# Patient Record
Sex: Male | Born: 1975 | Race: White | Hispanic: No | Smoking: Current every day smoker
Health system: Southern US, Community
[De-identification: ages and names within clinical notes are randomized; demographics above are authoritative.]

## PROBLEM LIST (undated history)

## (undated) DIAGNOSIS — K5781 Diverticulitis of intestine, part unspecified, with perforation and abscess with bleeding: Secondary | ICD-10-CM

## (undated) DIAGNOSIS — F4310 Post-traumatic stress disorder, unspecified: Secondary | ICD-10-CM

## (undated) DIAGNOSIS — G629 Polyneuropathy, unspecified: Secondary | ICD-10-CM

## (undated) DIAGNOSIS — Z87442 Personal history of urinary calculi: Secondary | ICD-10-CM

## (undated) HISTORY — PX: FOOT SURGERY: SHX648

---

## 2003-09-13 HISTORY — PX: FOOT SURGERY: SHX648

## 2006-02-22 ENCOUNTER — Ambulatory Visit: Payer: Self-pay | Admitting: Family Medicine

## 2014-07-14 ENCOUNTER — Emergency Department: Payer: Self-pay | Admitting: Emergency Medicine

## 2014-07-14 LAB — CBC
HCT: 40.9 % (ref 40.0–52.0)
HGB: 13.6 g/dL (ref 13.0–18.0)
MCH: 31.5 pg (ref 26.0–34.0)
MCHC: 33.3 g/dL (ref 32.0–36.0)
MCV: 95 fL (ref 80–100)
Platelet: 265 10*3/uL (ref 150–440)
RBC: 4.33 10*6/uL — AB (ref 4.40–5.90)
RDW: 12.7 % (ref 11.5–14.5)
WBC: 13.5 10*3/uL — ABNORMAL HIGH (ref 3.8–10.6)

## 2014-07-15 LAB — BASIC METABOLIC PANEL
Anion Gap: 8 (ref 7–16)
BUN: 27 mg/dL — ABNORMAL HIGH (ref 7–18)
CHLORIDE: 104 mmol/L (ref 98–107)
CREATININE: 2.74 mg/dL — AB (ref 0.60–1.30)
Calcium, Total: 8.7 mg/dL (ref 8.5–10.1)
Co2: 27 mmol/L (ref 21–32)
EGFR (African American): 34 — ABNORMAL LOW
GFR CALC NON AF AMER: 28 — AB
Glucose: 96 mg/dL (ref 65–99)
Osmolality: 283 (ref 275–301)
POTASSIUM: 4.3 mmol/L (ref 3.5–5.1)
Sodium: 139 mmol/L (ref 136–145)

## 2014-07-15 LAB — URINALYSIS, COMPLETE
BILIRUBIN, UR: NEGATIVE
Bacteria: NONE SEEN
Glucose,UR: NEGATIVE mg/dL (ref 0–75)
Ketone: NEGATIVE
Leukocyte Esterase: NEGATIVE
NITRITE: NEGATIVE
PH: 6 (ref 4.5–8.0)
Protein: 30
Specific Gravity: 1.005 (ref 1.003–1.030)
Squamous Epithelial: NONE SEEN

## 2015-05-26 ENCOUNTER — Other Ambulatory Visit: Payer: Self-pay | Admitting: Family Medicine

## 2015-05-26 ENCOUNTER — Ambulatory Visit
Admission: RE | Admit: 2015-05-26 | Discharge: 2015-05-26 | Disposition: A | Discharge status: Home / Self Care | Payer: Disability Insurance | Source: Ambulatory Visit | Attending: Family Medicine | Admitting: Family Medicine

## 2015-05-26 DIAGNOSIS — M25562 Pain in left knee: Secondary | ICD-10-CM

## 2015-05-26 DIAGNOSIS — M25552 Pain in left hip: Secondary | ICD-10-CM

## 2015-09-21 ENCOUNTER — Emergency Department: Payer: Disability Insurance

## 2015-09-21 ENCOUNTER — Encounter: Payer: Self-pay | Admitting: *Deleted

## 2015-09-21 ENCOUNTER — Emergency Department
Admission: EM | Admit: 2015-09-21 | Discharge: 2015-09-21 | Disposition: A | Discharge status: Home / Self Care | Payer: Disability Insurance | Attending: Emergency Medicine | Admitting: Emergency Medicine

## 2015-09-21 DIAGNOSIS — F17200 Nicotine dependence, unspecified, uncomplicated: Secondary | ICD-10-CM | POA: Insufficient documentation

## 2015-09-21 DIAGNOSIS — R079 Chest pain, unspecified: Secondary | ICD-10-CM | POA: Insufficient documentation

## 2015-09-21 HISTORY — DX: Polyneuropathy, unspecified: G62.9

## 2015-09-21 LAB — CBC
HEMATOCRIT: 47.3 % (ref 40.0–52.0)
Hemoglobin: 15.9 g/dL (ref 13.0–18.0)
MCH: 30.9 pg (ref 26.0–34.0)
MCHC: 33.6 g/dL (ref 32.0–36.0)
MCV: 92 fL (ref 80.0–100.0)
Platelets: 330 10*3/uL (ref 150–440)
RBC: 5.14 MIL/uL (ref 4.40–5.90)
RDW: 13.2 % (ref 11.5–14.5)
WBC: 15.8 10*3/uL — ABNORMAL HIGH (ref 3.8–10.6)

## 2015-09-21 LAB — BASIC METABOLIC PANEL
Anion gap: 9 (ref 5–15)
BUN: 12 mg/dL (ref 6–20)
CHLORIDE: 105 mmol/L (ref 101–111)
CO2: 24 mmol/L (ref 22–32)
Calcium: 9.6 mg/dL (ref 8.9–10.3)
Creatinine, Ser: 1.15 mg/dL (ref 0.61–1.24)
GFR calc Af Amer: >60 mL/min (ref >60)
GFR calc non Af Amer: >60 mL/min (ref >60)
GLUCOSE: 97 mg/dL (ref 65–99)
POTASSIUM: 4 mmol/L (ref 3.5–5.1)
Sodium: 138 mmol/L (ref 135–145)

## 2015-09-21 LAB — TROPONIN I: Troponin I: <0.03 ng/mL (ref <0.031)

## 2015-09-21 MED ORDER — IBUPROFEN 800 MG PO TABS
800.0000 mg | ORAL_TABLET | Freq: Once | ORAL | Status: AC
  Administered 2015-09-21: 800 mg via ORAL

## 2015-09-21 MED ORDER — IBUPROFEN 800 MG PO TABS
ORAL_TABLET | ORAL | Status: AC
Start: 2015-09-21 — End: 2015-09-21
  Administered 2015-09-21: 800 mg via ORAL
  Filled 2015-09-21: qty 1

## 2015-09-21 MED ORDER — CYCLOBENZAPRINE HCL 10 MG PO TABS: 10.0000 mg | ORAL_TABLET | Freq: Three times a day (TID) | ORAL | Status: DC | PRN

## 2015-09-21 MED ORDER — IBUPROFEN 800 MG PO TABS: 800.0000 mg | ORAL_TABLET | Freq: Three times a day (TID) | ORAL | Status: DC | PRN

## 2015-09-21 MED ORDER — HYDROCODONE-ACETAMINOPHEN 5-325 MG PO TABS: 1.0000 | ORAL_TABLET | Freq: Four times a day (QID) | ORAL | Status: DC | PRN

## 2015-09-21 NOTE — Discharge Instructions (Signed)
Although no certain cause was found for your left sided chest discomfort, your exam and evaluation are reassuring in the ED today.  You do need to follow-up with a cardiologist for elevation and stress test.  I spoke with Dr. Kirke CorinArida, cardiology, and his office will call tomorrow to set up the appointment and stress test this week.   I am most suspicious that your symptoms are due to inflammation. Based on this, I suggest treating with ibuprofen 800 mg every 8 hours as needed for pain and inflammation for one week.  You are also being discharged with muscle relaxer flexeril and few tablets of Norco.  Return to the emergency department for any worsening condition including any worsening chest pain, weakness or numbness, vomiting, black or bloody stools, dizziness or passing out, trouble breathing or pain with breathing, shortness of breath, or any other symptoms concerning to you.   Nonspecific Chest Pain It is often hard to find the cause of chest pain. There is always a chance that your pain could be related to something serious, such as a heart attack or a blood clot in your lungs. Chest pain can also be caused by conditions that are not life-threatening. If you have chest pain, it is very important to follow up with your doctor.  HOME CARE  If you were prescribed an antibiotic medicine, finish it all even if you start to feel better.  Avoid any activities that cause chest pain.  Do not use any tobacco products, including cigarettes, chewing tobacco, or electronic cigarettes. If you need help quitting, ask your doctor.  Do not drink alcohol.  Take medicines only as told by your doctor.  Keep all follow-up visits as told by your doctor. This is important. This includes any further testing if your chest pain does not go away.  Your doctor may tell you to keep your head raised (elevated) while you sleep.  Make lifestyle changes as told by your doctor. These may include:  Getting regular  exercise. Ask your doctor to suggest some activities that are safe for you.  Eating a heart-healthy diet. Your doctor or a diet specialist (dietitian) can help you to learn healthy eating options.  Maintaining a healthy weight.  Managing diabetes, if necessary.  Reducing stress. GET HELP IF:  Your chest pain does not go away, even after treatment.  You have a rash with blisters on your chest.  You have a fever. GET HELP RIGHT AWAY IF:  Your chest pain is worse.  You have an increasing cough, or you cough up blood.  You have severe belly (abdominal) pain.  You feel extremely weak.  You pass out (faint).  You have chills.  You have sudden, unexplained chest discomfort.  You have sudden, unexplained discomfort in your arms, back, neck, or jaw.  You have shortness of breath at any time.  You suddenly start to sweat, or your skin gets clammy.  You feel nauseous.  You vomit.  You suddenly feel light-headed or dizzy.  Your heart begins to beat quickly, or it feels like it is skipping beats. These symptoms may be an emergency. Do not wait to see if the symptoms will go away. Get medical help right away. Call your local emergency services (911 in the [place] Do not drive yourself to the hospital.   This information is not intended to replace advice given to you by your health care provider. Make sure you discuss any questions you have with your health care provider.  Document Released: 02/15/2008 Document Revised: 09/19/2014 Document Reviewed: 04/04/2014 Elsevier Interactive Patient Education [employer]

## 2015-09-21 NOTE — ED Notes (Signed)
AAOx3.  Skin warm and dry.  NAD 

## 2015-09-21 NOTE — ED Notes (Signed)
Patient c/o left side chest pain that is intermittent for 3 days. Patient states it radiates to back and down left arm.

## 2015-09-21 NOTE — ED Provider Notes (Signed)
[facility] Emergency Department Provider Note   ____________________________________________  Time seen: Approximately 4 PM I have reviewed the triage vital signs and the triage nursing note.  HISTORY  Chief Complaint Chest Pain   Historian Patient  HPI Christopher Espinoza is a 40 y.o. male with a smoking history, and possible family history for coronary artery disease, who is here for evaluation of 4 days of waxing and waning left anterior chest wall pain that feels like a spasm that at times has gone into his left shoulder and into his upper left back. No weakness or numbness, but does report some possible tingling at one point time over the left anterior shoulder, and that is gone now.  Symptoms started about 4 days ago at rest, and have been there the entire time but at times worse than others. No exacerbating or alleviating factors. He does not feel this is abdomen/GERD related. He does report being under a lot of stress. He has not had a stress test or cardiology evaluation in the past.  Symptoms at worst were severe, and presently are mild to moderate.    Past Medical History  Diagnosis Date  . 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   Past Surgical History  Procedure Laterality Date  . Foot surgery Right     Current Outpatient Rx  Name  Route  Sig  Dispense  Refill  . cyclobenzaprine (FLEXERIL) 10 MG tablet   Oral   Take 1 tablet (10 mg total) by mouth every 8 (eight) hours as needed for muscle spasms.   20 tablet   0   . HYDROcodone-acetaminophen (NORCO/VICODIN) 5-325 MG tablet   Oral   Take 1 tablet by mouth every 6 (six) hours as needed for moderate pain.   5 tablet   0   . ibuprofen (ADVIL,MOTRIN) 800 MG tablet   Oral   Take 1 tablet (800 mg total) by mouth every 8 (eight) hours as needed.   30 tablet   0     Allergies Gabapentin  No family history on  file.  Social History Social History  Substance Use Topics  . Smoking status: Current Every Day Smoker  . Smokeless tobacco: None  . Alcohol Use: No    Review of Systems  Constitutional: Negative for fever. Eyes: Negative for visual changes. ENT: Negative for sore throat. Cardiovascular: Positive for chest pain. Negative for palpitations Respiratory: Negative for shortness of breath. Gastrointestinal: Negative for abdominal pain, vomiting and diarrhea. Genitourinary: Negative for dysuria. Musculoskeletal: Negative for back pain. Skin: Negative for rash. Neurological: Negative for headache. 10 point Review of Systems otherwise negative ____________________________________________   PHYSICAL EXAM:  VITAL SIGNS: ED Triage Vitals  Enc Vitals Group     BP 09/21/15 1243 144/90 mmHg     Pulse Rate 09/21/15 1243 94     Resp 09/21/15 1243 18     Temp 09/21/15 1243 98.2 F (36.8 C)     Temp Source 09/21/15 1243 Oral     SpO2 09/21/15 1243 97 %     Weight 09/21/15 1243 195 lb (88.451 kg)     Height 09/21/15 1243 5\' 11"  (1.803 m)     Head Cir --      Peak Flow --      Pain Score 09/21/15 1248 7     Pain Loc --      Pain Edu? --  Excl. in [place]? --      Constitutional: Alert and oriented. Well appearing and in no distress. Eyes: Conjunctivae are normal. PERRL. Normal extraocular movements. ENT   Head: Normocephalic and atraumatic.   Nose: No congestion/rhinnorhea.   Mouth/Throat: Mucous membranes are moist.   Neck: No stridor. Cardiovascular/Chest: Normal rate, regular rhythm.  No murmurs, rubs, or gallops. Respiratory: Normal respiratory effort without tachypnea nor retractions. Breath sounds are clear and equal bilaterally. No wheezes/rales/rhonchi. Gastrointestinal: Soft. No distention, no guarding, no rebound. Nontender.    Genitourinary/rectal:Deferred Musculoskeletal: Nontender with normal range of motion in all extremities. No joint effusions.  No  lower extremity tenderness.  No edema. Neurologic:  Normal speech and language. No gross or focal neurologic deficits are appreciated. Skin:  Skin is warm, dry and intact. No rash noted. Psychiatric: Mood and affect are normal. Speech and behavior are normal. Patient exhibits appropriate insight and judgment.  ____________________________________________   EKG I, Governor Rooksebecca Briannia Laba, MD, the attending physician have personally viewed and interpreted all ECGs.  90 beats minute. Normal sinus rhythm with sinus arrhythmia. Narrow QRS. Normal axis. Normal ST and T-wave ____________________________________________  LABS (pertinent positives/negatives)  Basic metabolic panel within normal limits White blood count 15.8, hemoglobin 15.9 platelet count 3:30 Troponin less than 0.03  ____________________________________________  RADIOLOGY All Xrays were viewed by me. Imaging interpreted by Radiologist.  Chest 2 view: No active cardiopulmonary disease __________________________________________  PROCEDURES  Procedure(s) performed: None  Critical Care performed: None  ____________________________________________   ED COURSE / ASSESSMENT AND PLAN  CONSULTATIONS: Dr. Kirke CorinArida, cardiology, patient will be called tomorrow by the office to schedule appointment and stress test  Pertinent labs & imaging results that were available during my care of the patient were reviewed by me and considered in my medical decision making (see chart for details).  The patient has left-sided chest pain to the left shoulder and back at times, without any other associated symptoms. His exam and evaluation are reassuring with normal EKG and laboratory evaluation. His symptoms have been ongoing now for 4 days, and his troponin is negative. He does not have pleuritic chest pain, shortness of breath, fever or hypoxia, and I'm not suspicious for pulmonary embolism. Symptoms and physical exam aren't not raising my clinical  suspicion of vascular/aortic emergency. I discussed with the patient that I don't recommend further imaging at this point in time based on his reassuring exam and evaluation.  His symptoms do seem musculoskeletal, he reports pain when he moves side to side and when he palpates his left pectoral and left deltoid muscle. I question whether this could be costochondritis even with some medial tenderness over the sternal wall.  I'm going to treat him symptomatically with ibuprofen, Flexeril and a couple tablets of hydrocodone.  I discussed this case with the on-call cardiologist, who will see the patient this week.   Patient / Family / Caregiver informed of clinical course, medical decision-making process, and agree with plan.   I discussed return precautions, follow-up instructions, and discharged instructions with patient and/or family.  ___________________________________________   FINAL CLINICAL IMPRESSION(S) / ED DIAGNOSES   Final diagnoses:  Nonspecific chest pain              Note: This dictation was prepared with Dragon dictation. Any transcriptional errors that result from this process are unintentional   Governor Rooksebecca Kialee Kham, MD 09/21/15 1719

## 2015-09-22 ENCOUNTER — Other Ambulatory Visit: Payer: Self-pay

## 2015-09-22 ENCOUNTER — Telehealth: Payer: Self-pay

## 2015-09-22 DIAGNOSIS — R079 Chest pain, unspecified: Secondary | ICD-10-CM

## 2015-09-22 NOTE — Telephone Encounter (Signed)
Per Dr. Kirke CorinArida: "This patient was referred from the emergency room for chest pain. Schedul him for a treadmill stress test and follow-up after that." S/w pt who states he will need clearance [facility] before proceeding w/test. He had to call to 'get permission" to go to the ED on 1/9 for chest pain.  He is awaiting a return call from [facility] and will call us back when he has clearance to schedule.  States he can not walk on the treadmill d/t disability. He walks with a cane. Will make Dr. Kirke CorinArida aware

## 2015-10-05 ENCOUNTER — Telehealth: Payer: Self-pay

## 2015-10-05 NOTE — Telephone Encounter (Signed)
S/w pt who states he doesn't have an answer from the [place] regarding pt need for stress test. States he just signed papers today at [facility] to have his records sent there. At that point, if they find the test necessary, it would probably be scheduled at the [place]. Advised pt to notify us when a decision has been made. Pt agreeable w/plan with no further instructions.

## 2021-07-16 ENCOUNTER — Inpatient Hospital Stay: Payer: No Typology Code available for payment source | Admitting: Anesthesiology

## 2021-07-16 ENCOUNTER — Other Ambulatory Visit: Payer: Self-pay

## 2021-07-16 ENCOUNTER — Emergency Department: Payer: No Typology Code available for payment source

## 2021-07-16 ENCOUNTER — Encounter: Payer: Self-pay | Admitting: Emergency Medicine

## 2021-07-16 ENCOUNTER — Encounter
Admission: EM | Disposition: A | Discharge status: Home Health | Payer: No Typology Code available for payment source | Source: Home / Self Care | Attending: General Surgery

## 2021-07-16 ENCOUNTER — Inpatient Hospital Stay
Admission: EM | Admit: 2021-07-16 | Discharge: 2021-07-21 | DRG: 329 | Disposition: A | Discharge status: Home Health | Payer: No Typology Code available for payment source | Attending: General Surgery | Admitting: General Surgery

## 2021-07-16 DIAGNOSIS — R911 Solitary pulmonary nodule: Secondary | ICD-10-CM

## 2021-07-16 DIAGNOSIS — K760 Fatty (change of) liver, not elsewhere classified: Secondary | ICD-10-CM | POA: Diagnosis present

## 2021-07-16 DIAGNOSIS — E872 Acidosis, unspecified: Secondary | ICD-10-CM | POA: Diagnosis present

## 2021-07-16 DIAGNOSIS — F17210 Nicotine dependence, cigarettes, uncomplicated: Secondary | ICD-10-CM | POA: Diagnosis present

## 2021-07-16 DIAGNOSIS — Z91013 Allergy to seafood: Secondary | ICD-10-CM | POA: Diagnosis not present

## 2021-07-16 DIAGNOSIS — U071 COVID-19: Secondary | ICD-10-CM | POA: Diagnosis present

## 2021-07-16 DIAGNOSIS — K650 Generalized (acute) peritonitis: Secondary | ICD-10-CM | POA: Diagnosis present

## 2021-07-16 DIAGNOSIS — K5720 Diverticulitis of large intestine with perforation and abscess without bleeding: Secondary | ICD-10-CM | POA: Diagnosis present

## 2021-07-16 DIAGNOSIS — J9811 Atelectasis: Secondary | ICD-10-CM | POA: Diagnosis present

## 2021-07-16 DIAGNOSIS — K631 Perforation of intestine (nontraumatic): Secondary | ICD-10-CM

## 2021-07-16 DIAGNOSIS — I700 Atherosclerosis of aorta: Secondary | ICD-10-CM | POA: Diagnosis present

## 2021-07-16 HISTORY — PX: COLOSTOMY: SHX63

## 2021-07-16 HISTORY — PX: LAPAROTOMY: SHX154

## 2021-07-16 HISTORY — PX: COLON RESECTION SIGMOID: SHX6737

## 2021-07-16 LAB — RESP PANEL BY RT-PCR (FLU A&B, COVID) ARPGX2
Influenza A by PCR: NEGATIVE
Influenza B by PCR: NEGATIVE
SARS Coronavirus 2 by RT PCR: POSITIVE — AB

## 2021-07-16 LAB — URINALYSIS, ROUTINE W REFLEX MICROSCOPIC
Bilirubin Urine: NEGATIVE
Glucose, UA: NEGATIVE mg/dL
Hgb urine dipstick: NEGATIVE
Ketones, ur: 5 mg/dL — AB
Leukocytes,Ua: NEGATIVE
Nitrite: NEGATIVE
Protein, ur: 100 mg/dL — AB
Specific Gravity, Urine: 1.03 (ref 1.005–1.030)
pH: 5 (ref 5.0–8.0)

## 2021-07-16 LAB — COMPREHENSIVE METABOLIC PANEL
ALT: 49 U/L — ABNORMAL HIGH (ref 0–44)
AST: 34 U/L (ref 15–41)
Albumin: 4.6 g/dL (ref 3.5–5.0)
Alkaline Phosphatase: 55 U/L (ref 38–126)
Anion gap: 10 (ref 5–15)
BUN: 17 mg/dL (ref 6–20)
CO2: 25 mmol/L (ref 22–32)
Calcium: 8.9 mg/dL (ref 8.9–10.3)
Chloride: 98 mmol/L (ref 98–111)
Creatinine, Ser: 1.28 mg/dL — ABNORMAL HIGH (ref 0.61–1.24)
GFR, Estimated: >60 mL/min (ref >60)
Glucose, Bld: 131 mg/dL — ABNORMAL HIGH (ref 70–99)
Potassium: 4 mmol/L (ref 3.5–5.1)
Sodium: 133 mmol/L — ABNORMAL LOW (ref 135–145)
Total Bilirubin: 0.8 mg/dL (ref 0.3–1.2)
Total Protein: 7.5 g/dL (ref 6.5–8.1)

## 2021-07-16 LAB — LACTIC ACID, PLASMA: Lactic Acid, Venous: 2 mmol/L (ref 0.5–1.9)

## 2021-07-16 LAB — TYPE AND SCREEN
ABO/RH(D): B POS
Antibody Screen: NEGATIVE

## 2021-07-16 LAB — CBC
HCT: 47.8 % (ref 39.0–52.0)
Hemoglobin: 16.7 g/dL (ref 13.0–17.0)
MCH: 32.6 pg (ref 26.0–34.0)
MCHC: 34.9 g/dL (ref 30.0–36.0)
MCV: 93.2 fL (ref 80.0–100.0)
Platelets: 198 10*3/uL (ref 150–400)
RBC: 5.13 MIL/uL (ref 4.22–5.81)
RDW: 12.7 % (ref 11.5–15.5)
WBC: 11.4 10*3/uL — ABNORMAL HIGH (ref 4.0–10.5)
nRBC: 0 % (ref 0.0–0.2)

## 2021-07-16 LAB — PROTIME-INR
INR: 1 (ref 0.8–1.2)
Prothrombin Time: 13.4 seconds (ref 11.4–15.2)

## 2021-07-16 LAB — LIPASE, BLOOD: Lipase: 37 U/L (ref 11–51)

## 2021-07-16 LAB — ABO/RH: ABO/RH(D): B POS

## 2021-07-16 SURGERY — LAPAROTOMY, EXPLORATORY
Anesthesia: General

## 2021-07-16 MED ORDER — PROPOFOL 10 MG/ML IV BOLUS
INTRAVENOUS | Status: AC
  Filled 2021-07-16: qty 20

## 2021-07-16 MED ORDER — PIPERACILLIN-TAZOBACTAM 3.375 G IVPB
3.3750 g | Freq: Three times a day (TID) | INTRAVENOUS | Status: DC
  Administered 2021-07-16 – 2021-07-21 (×15): 3.375 g via INTRAVENOUS
  Filled 2021-07-16 (×15): qty 50

## 2021-07-16 MED ORDER — LORAZEPAM 2 MG/ML IJ SOLN
1.0000 mg | INTRAMUSCULAR | Status: DC | PRN
  Administered 2021-07-16 – 2021-07-19 (×10): 1 mg via INTRAVENOUS
  Filled 2021-07-16 (×10): qty 1

## 2021-07-16 MED ORDER — DEXAMETHASONE SODIUM PHOSPHATE 10 MG/ML IJ SOLN
INTRAMUSCULAR | Status: DC | PRN
Start: 2021-07-16 — End: 2021-07-16
  Administered 2021-07-16: 10 mg via INTRAVENOUS

## 2021-07-16 MED ORDER — KETOROLAC TROMETHAMINE 30 MG/ML IJ SOLN
INTRAMUSCULAR | Status: AC
  Filled 2021-07-16: qty 1

## 2021-07-16 MED ORDER — FENTANYL CITRATE (PF) 100 MCG/2ML IJ SOLN
25.0000 ug | INTRAMUSCULAR | Status: DC | PRN
  Administered 2021-07-16 (×2): 25 ug via INTRAVENOUS

## 2021-07-16 MED ORDER — KETAMINE HCL 50 MG/5ML IJ SOSY
PREFILLED_SYRINGE | INTRAMUSCULAR | Status: AC
  Filled 2021-07-16: qty 5

## 2021-07-16 MED ORDER — LIDOCAINE HCL (CARDIAC) PF 100 MG/5ML IV SOSY
PREFILLED_SYRINGE | INTRAVENOUS | Status: DC | PRN
  Administered 2021-07-16: 50 mg via INTRAVENOUS

## 2021-07-16 MED ORDER — KETOROLAC TROMETHAMINE 30 MG/ML IJ SOLN
INTRAMUSCULAR | Status: DC | PRN
  Administered 2021-07-16: 15 mg via INTRAVENOUS

## 2021-07-16 MED ORDER — PHENYLEPHRINE HCL (PRESSORS) 10 MG/ML IV SOLN
INTRAVENOUS | Status: AC
  Filled 2021-07-16: qty 1

## 2021-07-16 MED ORDER — PHENYLEPHRINE HCL-NACL 20-0.9 MG/250ML-% IV SOLN
INTRAVENOUS | Status: DC | PRN
  Administered 2021-07-16: 30 ug/min via INTRAVENOUS

## 2021-07-16 MED ORDER — LIDOCAINE HCL (PF) 2 % IJ SOLN
INTRAMUSCULAR | Status: AC
  Filled 2021-07-16: qty 5

## 2021-07-16 MED ORDER — PIPERACILLIN-TAZOBACTAM 3.375 G IVPB 30 MIN
3.3750 g | Freq: Once | INTRAVENOUS | Status: AC
  Administered 2021-07-16: 3.375 g via INTRAVENOUS
  Filled 2021-07-16: qty 50

## 2021-07-16 MED ORDER — BUPIVACAINE-EPINEPHRINE (PF) 0.5% -1:200000 IJ SOLN
INTRAMUSCULAR | Status: AC
  Filled 2021-07-16: qty 30

## 2021-07-16 MED ORDER — SUGAMMADEX SODIUM 500 MG/5ML IV SOLN
INTRAVENOUS | Status: DC | PRN
  Administered 2021-07-16: 150 mg via INTRAVENOUS

## 2021-07-16 MED ORDER — BUPIVACAINE LIPOSOME 1.3 % IJ SUSP
INTRAMUSCULAR | Status: AC
  Filled 2021-07-16: qty 20

## 2021-07-16 MED ORDER — SODIUM CHLORIDE 0.9 % IV SOLN: INTRAVENOUS | Status: DC | PRN

## 2021-07-16 MED ORDER — FENTANYL CITRATE (PF) 100 MCG/2ML IJ SOLN
INTRAMUSCULAR | Status: AC
  Filled 2021-07-16: qty 2

## 2021-07-16 MED ORDER — LACTATED RINGERS IV SOLN: INTRAVENOUS | Status: DC | PRN

## 2021-07-16 MED ORDER — SODIUM CHLORIDE 0.9 % IV SOLN
INTRAVENOUS | Status: DC | PRN
  Administered 2021-07-16: 70 mL

## 2021-07-16 MED ORDER — DEXMEDETOMIDINE HCL IN NACL 200 MCG/50ML IV SOLN
INTRAVENOUS | Status: DC | PRN
  Administered 2021-07-16 (×2): 4 ug via INTRAVENOUS
  Administered 2021-07-16 (×2): 8 ug via INTRAVENOUS
  Administered 2021-07-16: 10 ug via INTRAVENOUS
  Administered 2021-07-16 (×2): 8 ug via INTRAVENOUS
  Administered 2021-07-16: 10 ug via INTRAVENOUS

## 2021-07-16 MED ORDER — PHENYLEPHRINE HCL-NACL 20-0.9 MG/250ML-% IV SOLN
INTRAVENOUS | Status: AC
  Filled 2021-07-16: qty 250

## 2021-07-16 MED ORDER — SUCCINYLCHOLINE CHLORIDE 200 MG/10ML IV SOSY
PREFILLED_SYRINGE | INTRAVENOUS | Status: DC | PRN
  Administered 2021-07-16: 100 mg via INTRAVENOUS

## 2021-07-16 MED ORDER — BUPIVACAINE-EPINEPHRINE (PF) 0.5% -1:200000 IJ SOLN
INTRAMUSCULAR | Status: DC | PRN
  Administered 2021-07-16: 30 mL

## 2021-07-16 MED ORDER — FENTANYL CITRATE (PF) 100 MCG/2ML IJ SOLN
INTRAMUSCULAR | Status: DC | PRN
  Administered 2021-07-16 (×4): 50 ug via INTRAVENOUS

## 2021-07-16 MED ORDER — 0.9 % SODIUM CHLORIDE (POUR BTL) OPTIME
TOPICAL | Status: DC | PRN
  Administered 2021-07-16: 3000 mL

## 2021-07-16 MED ORDER — ONDANSETRON HCL 4 MG/2ML IJ SOLN
INTRAMUSCULAR | Status: DC | PRN
  Administered 2021-07-16: 4 mg via INTRAVENOUS

## 2021-07-16 MED ORDER — PROPOFOL 10 MG/ML IV BOLUS
INTRAVENOUS | Status: DC | PRN
  Administered 2021-07-16: 170 mg via INTRAVENOUS

## 2021-07-16 MED ORDER — ROCURONIUM BROMIDE 100 MG/10ML IV SOLN
INTRAVENOUS | Status: DC | PRN
Start: 2021-07-16 — End: 2021-07-16
  Administered 2021-07-16: 10 mg via INTRAVENOUS
  Administered 2021-07-16: 20 mg via INTRAVENOUS
  Administered 2021-07-16: 30 mg via INTRAVENOUS
  Administered 2021-07-16: 40 mg via INTRAVENOUS

## 2021-07-16 MED ORDER — ROCURONIUM BROMIDE 10 MG/ML (PF) SYRINGE
PREFILLED_SYRINGE | INTRAVENOUS | Status: AC
  Filled 2021-07-16: qty 10

## 2021-07-16 MED ORDER — KETAMINE HCL 10 MG/ML IJ SOLN
INTRAMUSCULAR | Status: DC | PRN
  Administered 2021-07-16 (×2): 10 mg via INTRAVENOUS
  Administered 2021-07-16: 30 mg via INTRAVENOUS

## 2021-07-16 MED ORDER — ACETAMINOPHEN 10 MG/ML IV SOLN
INTRAVENOUS | Status: DC | PRN
Start: 2021-07-16 — End: 2021-07-16
  Administered 2021-07-16: 1000 mg via INTRAVENOUS

## 2021-07-16 MED ORDER — SODIUM CHLORIDE 0.9 % IV BOLUS (SEPSIS)
1000.0000 mL | Freq: Once | INTRAVENOUS | Status: AC
  Administered 2021-07-16: 1000 mL via INTRAVENOUS

## 2021-07-16 MED ORDER — DEXAMETHASONE SODIUM PHOSPHATE 10 MG/ML IJ SOLN
INTRAMUSCULAR | Status: AC
  Filled 2021-07-16: qty 1

## 2021-07-16 MED ORDER — DEXMEDETOMIDINE HCL IN NACL 200 MCG/50ML IV SOLN
INTRAVENOUS | Status: AC
  Filled 2021-07-16: qty 50

## 2021-07-16 MED ORDER — OXYCODONE HCL 5 MG PO TABS: 5.0000 mg | ORAL_TABLET | Freq: Once | ORAL | Status: DC | PRN

## 2021-07-16 MED ORDER — SODIUM CHLORIDE 0.9 % IV SOLN: INTRAVENOUS | Status: DC

## 2021-07-16 MED ORDER — ENOXAPARIN SODIUM 40 MG/0.4ML IJ SOSY
40.0000 mg | PREFILLED_SYRINGE | INTRAMUSCULAR | Status: DC
  Administered 2021-07-17 – 2021-07-21 (×5): 40 mg via SUBCUTANEOUS
  Filled 2021-07-16 (×5): qty 0.4

## 2021-07-16 MED ORDER — OXYCODONE HCL 5 MG/5ML PO SOLN: 5.0000 mg | Freq: Once | ORAL | Status: DC | PRN

## 2021-07-16 MED ORDER — PHENYLEPHRINE HCL (PRESSORS) 10 MG/ML IV SOLN
INTRAVENOUS | Status: DC | PRN
  Administered 2021-07-16 (×2): 160 ug via INTRAVENOUS

## 2021-07-16 MED ORDER — ONDANSETRON HCL 4 MG/2ML IJ SOLN
INTRAMUSCULAR | Status: AC
  Filled 2021-07-16: qty 2

## 2021-07-16 MED ORDER — PROMETHAZINE HCL 25 MG/ML IJ SOLN: 6.2500 mg | INTRAMUSCULAR | Status: DC | PRN

## 2021-07-16 MED ORDER — PHENOL 1.4 % MT LIQD
1.0000 | OROMUCOSAL | Status: DC | PRN
  Administered 2021-07-16: 1 via OROMUCOSAL
  Filled 2021-07-16 (×2): qty 177

## 2021-07-16 MED ORDER — SODIUM CHLORIDE (PF) 0.9 % IJ SOLN
INTRAMUSCULAR | Status: AC
  Filled 2021-07-16: qty 50

## 2021-07-16 MED ORDER — MEPERIDINE HCL 25 MG/ML IJ SOLN: 6.2500 mg | INTRAMUSCULAR | Status: DC | PRN

## 2021-07-16 MED ORDER — MIDAZOLAM HCL 2 MG/2ML IJ SOLN
INTRAMUSCULAR | Status: DC | PRN
Start: 2021-07-16 — End: 2021-07-16
  Administered 2021-07-16: 2 mg via INTRAVENOUS

## 2021-07-16 MED ORDER — MIDAZOLAM HCL 2 MG/2ML IJ SOLN
INTRAMUSCULAR | Status: AC
  Filled 2021-07-16: qty 2

## 2021-07-16 MED ORDER — ONDANSETRON HCL 4 MG/2ML IJ SOLN
4.0000 mg | Freq: Once | INTRAMUSCULAR | Status: AC
  Administered 2021-07-16: 4 mg via INTRAVENOUS
  Filled 2021-07-16: qty 2

## 2021-07-16 MED ORDER — HYDROMORPHONE HCL 1 MG/ML IJ SOLN
1.0000 mg | Freq: Once | INTRAMUSCULAR | Status: AC
  Administered 2021-07-16: 1 mg via INTRAVENOUS
  Filled 2021-07-16: qty 1

## 2021-07-16 MED ORDER — CHLORHEXIDINE GLUCONATE CLOTH 2 % EX PADS
6.0000 | MEDICATED_PAD | Freq: Every day | CUTANEOUS | Status: DC
  Administered 2021-07-17 – 2021-07-19 (×3): 6 via TOPICAL

## 2021-07-16 MED ORDER — ACETAMINOPHEN 10 MG/ML IV SOLN
INTRAVENOUS | Status: AC
  Filled 2021-07-16: qty 100

## 2021-07-16 MED ORDER — SUGAMMADEX SODIUM 500 MG/5ML IV SOLN
INTRAVENOUS | Status: AC
  Filled 2021-07-16: qty 5

## 2021-07-16 MED ORDER — MORPHINE SULFATE (PF) 4 MG/ML IV SOLN
4.0000 mg | INTRAVENOUS | Status: DC | PRN
  Administered 2021-07-16 – 2021-07-19 (×12): 4 mg via INTRAVENOUS
  Filled 2021-07-16 (×12): qty 1

## 2021-07-16 SURGICAL SUPPLY — 44 items
BLADE CLIPPER SURG (BLADE) ×4 IMPLANT
BULB RESERV EVAC DRAIN JP 100C (MISCELLANEOUS) ×4 IMPLANT
CHLORAPREP W/TINT 26 (MISCELLANEOUS) ×4 IMPLANT
DRAIN CHANNEL JP 19F (MISCELLANEOUS) ×4 IMPLANT
DRAPE LAPAROTOMY 100X77 ABD (DRAPES) ×4 IMPLANT
DRSG OPSITE POSTOP 4X10 (GAUZE/BANDAGES/DRESSINGS) ×4 IMPLANT
DRSG OPSITE POSTOP 4X8 (GAUZE/BANDAGES/DRESSINGS) IMPLANT
DRSG TEGADERM 4X10 (GAUZE/BANDAGES/DRESSINGS) IMPLANT
DRSG TELFA 3X8 NADH (GAUZE/BANDAGES/DRESSINGS) IMPLANT
ELECT BLADE 6.5 EXT (BLADE) ×4 IMPLANT
ELECT REM PT RETURN 9FT ADLT (ELECTROSURGICAL) ×4
ELECTRODE REM PT RTRN 9FT ADLT (ELECTROSURGICAL) ×2 IMPLANT
GAUZE 4X4 16PLY ~~LOC~~+RFID DBL (SPONGE) ×4 IMPLANT
GLOVE SURG ENC MOIS LTX SZ6.5 (GLOVE) ×4 IMPLANT
GLOVE SURG UNDER POLY LF SZ6.5 (GLOVE) ×4 IMPLANT
GOWN STRL REUS W/ TWL LRG LVL3 (GOWN DISPOSABLE) ×4 IMPLANT
GOWN STRL REUS W/TWL LRG LVL3 (GOWN DISPOSABLE) ×4
KIT OSTOMY 2 PC DRNBL 2.25 STR (WOUND CARE) ×2 IMPLANT
KIT OSTOMY DRAINABLE 2.25 STR (WOUND CARE) ×2
KIT TURNOVER KIT A (KITS) ×4 IMPLANT
LABEL OR SOLS (LABEL) ×4 IMPLANT
MANIFOLD NEPTUNE II (INSTRUMENTS) ×4 IMPLANT
NS IRRIG 1000ML POUR BTL (IV SOLUTION) ×4 IMPLANT
PACK BASIN MAJOR ARMC (MISCELLANEOUS) ×4 IMPLANT
PACK COLON CLEAN CLOSURE (MISCELLANEOUS) ×4 IMPLANT
SEALER TISSUE X1 CVD JAW (INSTRUMENTS) ×4 IMPLANT
SPONGE T-LAP 18X18 ~~LOC~~+RFID (SPONGE) ×12 IMPLANT
STAPLER CVD CUT BL 40 RELOAD (ENDOMECHANICALS) ×4 IMPLANT
STAPLER PROXIMATE 75MM BLUE (STAPLE) ×4 IMPLANT
STAPLER SKIN PROX 35W (STAPLE) ×4 IMPLANT
SUT ETHILON 2 0 FS 18 (SUTURE) ×4 IMPLANT
SUT PDS AB 0 CT1 27 (SUTURE) ×8 IMPLANT
SUT PDS AB 1 TP1 54 (SUTURE) IMPLANT
SUT PROLENE 2 0 SH DA (SUTURE) ×4 IMPLANT
SUT SILK 2 0 (SUTURE) ×2
SUT SILK 2-0 18XBRD TIE 12 (SUTURE) ×2 IMPLANT
SUT SILK 3 0 (SUTURE)
SUT SILK 3-0 (SUTURE) ×4 IMPLANT
SUT SILK 3-0 18XBRD TIE 12 (SUTURE) IMPLANT
SUT VIC AB 3-0 SH 27 (SUTURE) ×4
SUT VIC AB 3-0 SH 27X BRD (SUTURE) ×4 IMPLANT
SUT VICRYL 3-0 CR8 SH (SUTURE) ×12 IMPLANT
TRAY FOLEY MTR SLVR 16FR STAT (SET/KITS/TRAYS/PACK) ×4 IMPLANT
WATER STERILE IRR 500ML POUR (IV SOLUTION) ×4 IMPLANT

## 2021-07-16 NOTE — Anesthesia Procedure Notes (Signed)
Procedure Name: Intubation Date/Time: 07/16/2021 8:03 AM Performed by: Reece Agar, CRNA Pre-anesthesia Checklist: Patient identified, Emergency Drugs available, Suction available and Patient being monitored Patient Re-evaluated:Patient Re-evaluated prior to induction Oxygen Delivery Method: Circle system utilized Preoxygenation: Pre-oxygenation with 100% oxygen Induction Type: IV induction and Rapid sequence Laryngoscope Size: McGraph and 4 Grade View: Grade I Tube type: Oral Tube size: 7.0 mm Number of attempts: 1 Airway Equipment and Method: Stylet and Oral airway Placement Confirmation: ETT inserted through vocal cords under direct vision, positive ETCO2 and breath sounds checked- equal and bilateral Secured at: 22 cm Tube secured with: Tape Dental Injury: Teeth and Oropharynx as per pre-operative assessment

## 2021-07-16 NOTE — ED Provider Notes (Signed)
[facility] Emergency Department Provider Note  ____________________________________________   Event Date/Time   First MD Initiated Contact with Patient 07/16/21 [phone]     (approximate)  I have reviewed the triage vital signs and the nursing notes.   HISTORY  Chief Complaint Abdominal Pain    HPI Christopher Espinoza is a 45 y.o. male with history of neuropathy who presents to the emergency department with 2 days of generalized, sharp and severe abdominal pain without radiation.  No aggravating or alleviating factors.  Reports he has had subjective fevers and chills at home as well as severe nausea.  No vomiting or diarrhea.  No dysuria or hematuria.  No previous history of abdominal surgery.  States he has had previous diverticulitis as well as kidney stones.        Past Medical History:  Diagnosis Date   Neuropathy right foot   due to trigger toe    Patient Active Problem List   Diagnosis Date Noted   Diverticulitis of colon with perforation 07/16/2021     Past Surgical History:  Procedure Laterality Date   FOOT SURGERY Right     Prior to Admission medications   Not on File    Allergies Shellfish allergy and Gabapentin  History reviewed. No pertinent family history.  Social History Social History   Tobacco Use   Smoking status: Every Day  Substance Use Topics   Alcohol use: No    Review of Systems Constitutional: No fever. Eyes: No visual changes. ENT: No sore throat. Cardiovascular: Denies chest pain. Respiratory: Denies shortness of breath. Gastrointestinal: No nausea, vomiting, diarrhea. Genitourinary: Negative for dysuria. Musculoskeletal: Negative for back pain. Skin: Negative for rash. Neurological: Negative for focal weakness or numbness.  ____________________________________________   PHYSICAL EXAM:  VITAL SIGNS: ED Triage Vitals [07/16/21 0636]  Enc Vitals Group     BP 111/87     Pulse Rate (!) 106     Resp  20     Temp 99.8 F (37.7 C)     Temp Source Oral     SpO2 93 %     Weight      Height      Head Circumference      Peak Flow      Pain Score      Pain Loc      Pain Edu?      Excl. in [place]?    CONSTITUTIONAL: Alert and oriented and responds appropriately to questions.  Appears pale.  Appears very uncomfortable.  Has a difficult time getting out of the wheelchair and getting into the bed on his own. HEAD: Normocephalic EYES: Conjunctivae clear, pupils appear equal, EOM appear intact ENT: normal nose; moist mucous membranes NECK: Supple, normal ROM CARD: Regular and tachycardic; S1 and S2 appreciated; no murmurs, no clicks, no rubs, no gallops RESP: Normal chest excursion without splinting or tachypnea; breath sounds clear and equal bilaterally; no wheezes, no rhonchi, no rales, no hypoxia or respiratory distress, speaking full sentences ABD/GI: Normal bowel sounds; patient is diffusely tender to palpation with guarding and rebound, nondistended abdomen BACK: The back appears normal EXT: Normal ROM in all joints; no deformity noted, no edema; no cyanosis SKIN: Normal color for age and race; warm; no rash on exposed skin NEURO: Moves all extremities equally PSYCH: The patient's mood and manner are appropriate.  ____________________________________________   LABS (all labs ordered are listed, but only abnormal results are displayed)  Labs Reviewed  COMPREHENSIVE METABOLIC PANEL -  Abnormal; Notable for the following components:      Result Value   Sodium 133 (*)    Glucose, Bld 131 (*)    Creatinine, Ser 1.28 (*)    ALT 49 (*)    All other components within normal limits  CBC - Abnormal; Notable for the following components:   WBC 11.4 (*)    All other components within normal limits  URINALYSIS, ROUTINE W REFLEX MICROSCOPIC - Abnormal; Notable for the following components:   Color, Urine AMBER (*)    APPearance HAZY (*)    Ketones, ur 5 (*)    Protein, ur 100 (*)     Bacteria, UA RARE (*)    All other components within normal limits  RESP PANEL BY RT-PCR (FLU A&B, COVID) ARPGX2  CULTURE, BLOOD (ROUTINE X 2)  CULTURE, BLOOD (ROUTINE X 2)  LIPASE, BLOOD  PROTIME-INR  LACTIC ACID, PLASMA  HIV ANTIBODY (ROUTINE TESTING W REFLEX)  TYPE AND SCREEN   ____________________________________________  EKG   ____________________________________________  RADIOLOGY I, Mea Ozga, personally viewed and evaluated these images (plain radiographs) as part of my medical decision making, as well as reviewing the written report by the radiologist.  ED MD interpretation: Bowel perforation with free air.  Official radiology report(s): CT Renal Stone Study  Result Date: 07/16/2021 CLINICAL DATA:  Flank pain, suspected kidney stone in a 45 year old male. EXAM: CT ABDOMEN AND PELVIS WITHOUT CONTRAST TECHNIQUE: Multidetector CT imaging of the abdomen and pelvis was performed following the standard protocol without IV contrast. COMPARISON:  July 15, 2014. FINDINGS: Lower chest: Nodular density at the RIGHT lung base (image 1/2) this measures approximately 8 x 7 mm but is incompletely imaged. Basilar atelectasis admixed in this region. No effusion. No dense consolidative process. Hepatobiliary: Hepatic steatosis. No visible discrete lesion. Steatosis at least moderate. No pericholecystic stranding. Pancreas: Pancreas with smooth contours, no signs of inflammation. Spleen: Spleen normal size and contour. Adrenals/Urinary Tract: Adrenal glands are normal. Smooth contour the bilateral kidneys. No hydronephrosis. No perivesical stranding or perinephric stranding. Stomach/Bowel: No perigastric stranding. No gastric distension. No small bowel obstruction. Appendix is normal. Proximal colon is fluid-filled. Sigmoid colon at the descending sigmoid junction with thickening and small locules of pneumoperitoneum surrounding the colon in this location mixed with stranding. Free  intraperitoneal air tracks beneath the RIGHT hemidiaphragm as well and about the liver. Scattered locules of free air seen in the LEFT lower quadrant away from the area perforated diverticulitis. Vascular/Lymphatic: Aortic atherosclerosis. No sign of aneurysm. Smooth contour of the IVC. There is no gastrohepatic or hepatoduodenal ligament lymphadenopathy. No retroperitoneal or mesenteric lymphadenopathy. No pelvic sidewall lymphadenopathy. Reproductive: Unremarkable Other: Free air as above.  No abscess.  No ascites. Musculoskeletal: No acute or destructive bone finding. IMPRESSION: Signs of perforated diverticulitis with pneumoperitoneum tracking into the LEFT lower quadrant small bowel mesentery and beneath the RIGHT and LEFT hemidiaphragm. Nodular density at the RIGHT lung base this measures approximately 8 x 7 mm but is incompletely imaged. Basilar atelectasis admixed in this region. Given that this is on the first image of the CT exam would suggest follow-up chest CT for more complete characterization when the patient is able or in 4-6 weeks. Hepatic steatosis. Aortic Atherosclerosis (ICD10-I70.0). Critical Value/emergent results were called by telephone at the time of interpretation on 07/16/2021 at 6:27 am to provider [facility] , who verbally acknowledged these results. At that time small nodule in the LEFT lower lobe was discussed as well. Electronically Signed  By: Donzetta Kohut M.D.   On: 07/16/2021 06:28    ____________________________________________   PROCEDURES  Procedure(s) performed (including Critical Care):  Procedures  CRITICAL CARE Performed by: Baxter Hire Yatziry Deakins   Total critical care time: 50 minutes  Critical care time was exclusive of separately billable procedures and treating other patients.  Critical care was necessary to treat or prevent imminent or life-threatening deterioration.  Critical care was time spent personally by me on the following activities: development of  treatment plan with patient and/or surrogate as well as nursing, discussions with consultants, evaluation of patient's response to treatment, examination of patient, obtaining history from patient or surrogate, ordering and performing treatments and interventions, ordering and review of laboratory studies, ordering and review of radiographic studies, pulse oximetry and re-evaluation of patient's condition.  ____________________________________________   INITIAL IMPRESSION / ASSESSMENT AND PLAN / ED COURSE  As part of my medical decision making, I reviewed the following data within the electronic MEDICAL RECORD NUMBER Nursing notes reviewed and incorporated, Labs reviewed , Old chart reviewed, Discussed with radiologist, A consult was requested and obtained from this/these consultant(s) Surgery, CT reviewed, and Notes from prior ED visits         Patient here with complaints of severe abdominal pain for 2 days.  Labs, urine and CT of the abdomen pelvis obtained immediately upon arrival to the emergency department.  I was contacted by the radiologist with concerns that patient has a bowel perforation.  He has pneumoperitoneum tracking into the left lower quadrant small bowel mesentery and beneath the right and left hemidiaphragms.  Patient was brought back to her room immediately after receiving a call from radiology.  On my exam, patient's abdomen is peritoneal.  Given significant free air seen on CT imaging and peritoneal abdomen will contact surgery immediately.  Will place second peripheral IV and give IV Zosyn.  Will obtain blood cultures.  Will give IV fluids, pain and nausea medicine.  We will keep n.p.o. at this time.  ED PROGRESS  6:37 AM  Discussed with Dr. Maia Plan with general surgery who will see patient in the emergency department.  7:05 AM  General surgery to take patient to operating room emergently.   I reviewed all nursing notes and pertinent previous records as available.  I have  reviewed and interpreted any EKGs, lab and urine results, imaging (as available).  ____________________________________________   FINAL CLINICAL IMPRESSION(S) / ED DIAGNOSES  Final diagnoses:  Bowel perforation [facility])  Pulmonary nodule     ED Discharge Orders     None       *Please note:  CHRITOPHER COSTER was evaluated in Emergency Department on 07/16/2021 for the symptoms described in the history of present illness. He was evaluated in the context of the global COVID-19 pandemic, which necessitated consideration that the patient might be at risk for infection with the SARS-CoV-2 virus that causes COVID-19. Institutional protocols and algorithms that pertain to the evaluation of patients at risk for COVID-19 are in a state of rapid change based on information released by regulatory bodies including the CDC and federal and state organizations. These policies and algorithms were followed during the patient's care in the ED.  Some ED evaluations and interventions may be delayed as a result of limited staffing during and the pandemic.*   Note:  This document was prepared using Dragon voice recognition software and may include unintentional dictation errors.    Aloma Boch, Layla Maw, DO 07/16/21 [phone]

## 2021-07-16 NOTE — Op Note (Signed)
Preoperative diagnosis: Diverticular disease with perforation.  Postoperative diagnosis: Diverticular disease with perforation.  Procedure: Sigmoid colon resection with colostomy (Hartmann's procedure).   Anesthesia: GETA  Surgeon: Dr. Hazle Quant, MD  Wound Classification: Dirty  Indications:  Patient is a 45 y.o. male with severe abdominal pain was found to have an acute perforation of the sigmoid colon. Emergent resection was indicated.  Description of procedure:  The patient was placed in the supine position and general endotracheal anesthesia was induced. A time-out was completed verifying correct patient, procedure, site, positioning, and implant(s) and/or special equipment prior to beginning this procedure. Preoperative antibiotics were given. A Foley catheter and nasogastric tube were placed. The abdomen was prepped and draped in the usual sterile fashion. A vertical midline incision was made from xiphoid to just above the pubis. This was deepened through the subcutaneous tissues and hemostasis was achieved with electrocautery. The linea alba was identified and incised and the peritoneal cavity entered. The abdomen was explored. Adhesions were lysed sharply under direct vision with Metzenbaum scissors. Generalized peritonitis was found.  The small bowel was inspected and retracted to the right using a moist towel and self-retaining retractor. Using electrocautery, the colon was freed from its peritoneal attachments along the line of Toldt. Both ureters were identified and protected.  Points of transection were selected proximally and distally. The bowel was divided with the linear cutting stapler. The peritoneum overlying the mesentery was then scored with electrocautery and mesentery transected with Enseal device. The specimen was removed. The abdominal cavity was then copiously irrigated and hemostasis was checked.  The proximal colon reached easily to the proposed colostomy site without  tension. A disk of skin was removed from the colostomy site in the left lower quadrant. The incision was deepened through all layers of the abdominal wall and dilated to admit two fingers. The colon was passed out through the ostomy site without torsion or tension.    The Hartmann's pouch was tagged with two long sutures of 2-0 prolene and allowed to fall into the pelvis.  A closed suction drain was placed in the pelvis and brought out through separate stab wounds lateral to the incision. These were secured with 3-0 nylon.  The fascia was closed with a running suture of PDS 0. The skin was closed with skin staples.  The colostomy was matured with multiple interrupted sutures of 3-0 Vicryl. An ostomy bag was applied.  The patient tolerated the procedure well and was taken to the postanesthesia care unit in stable condition.   Specimen: Sigmoid colon  Complications: None  EBL: 50 mL

## 2021-07-16 NOTE — Anesthesia Preprocedure Evaluation (Signed)
Anesthesia Evaluation  Patient identified by MRN, date of birth, ID band Patient awake    Reviewed: Allergy & Precautions, NPO status , Patient's Chart, lab work & pertinent test results  History of Anesthesia Complications Negative for: history of anesthetic complications  Airway Mallampati: III  TM Distance: >3 FB Neck ROM: Full    Dental  (+) Poor Dentition, Loose,    Pulmonary neg sleep apnea, neg COPD, Current Smoker and Patient abstained from smoking.,    breath sounds clear to auscultation- rhonchi (-) wheezing      Cardiovascular Exercise Tolerance: Good (-) hypertension(-) CAD, (-) Past MI, (-) Cardiac Stents and (-) CABG  Rhythm:Regular Rate:Normal - Systolic murmurs and - Diastolic murmurs    Neuro/Psych neg Seizures negative neurological ROS  negative psych ROS   GI/Hepatic negative GI ROS, Neg liver ROS,   Endo/Other  negative endocrine ROSneg diabetes  Renal/GU negative Renal ROS     Musculoskeletal negative musculoskeletal ROS (+)   Abdominal (+) - obese,   Peds  Hematology negative hematology ROS (+)   Anesthesia Other Findings Past Medical History: right foot: Neuropathy     Comment:  due to trigger toe   Reproductive/Obstetrics                             Anesthesia Physical Anesthesia Plan  ASA: 2  Anesthesia Plan: General   Post-op Pain Management:    Induction: Intravenous and Rapid sequence  PONV Risk Score and Plan: 0 and Ondansetron, Dexamethasone and Midazolam  Airway Management Planned: Oral ETT  Additional Equipment:   Intra-op Plan:   Post-operative Plan: Extubation in OR and Possible Post-op intubation/ventilation  Informed Consent: I have reviewed the patients History and Physical, chart, labs and discussed the procedure including the risks, benefits and alternatives for the proposed anesthesia with the patient or authorized representative who  has indicated his/her understanding and acceptance.     Dental advisory given  Plan Discussed with: CRNA and Anesthesiologist  Anesthesia Plan Comments:         Anesthesia Quick Evaluation

## 2021-07-16 NOTE — Anesthesia Postprocedure Evaluation (Signed)
Anesthesia Post Note  Patient: Christopher Espinoza  Procedure(s) Performed: EXPLORATORY LAPAROTOMY COLON RESECTION SIGMOID COLOSTOMY  Patient location during evaluation: PACU Anesthesia Type: General Level of consciousness: awake and alert and oriented Pain management: pain level controlled Vital Signs Assessment: post-procedure vital signs reviewed and stable Respiratory status: spontaneous breathing, nonlabored ventilation and respiratory function stable Cardiovascular status: blood pressure returned to baseline and stable Postop Assessment: no signs of nausea or vomiting Anesthetic complications: no   No notable events documented.   Last Vitals:  Vitals:   07/16/21 1331 07/16/21 1404  BP: 102/68 109/76  Pulse: 75 75  Resp: 16 16  Temp: 36.6 C 36.5 C  SpO2: 97% 96%    Last Pain:  Vitals:   07/16/21 1331  TempSrc: Oral  PainSc: 3                  Arnola Crittendon

## 2021-07-16 NOTE — H&P (Signed)
SURGICAL HISTORY AND PHYSICAL NOTE   HISTORY OF PRESENT ILLNESS (HPI):  45 y.o. male presented to [facility] ED for evaluation of abdominal pain since 2 days ago. Patient reports he is having severe abdominal pain.  Abdominal pain is generalized.  No pain radiation.  Aggravating factor is any movement of the abdominal wall.  No alleviating factors.  Patient denies any fever or chills.  Patient denies nausea or vomiting.  At the ED he was found with leukocytosis.  CT scan of the abdominal pelvis shows free air in the abdomen.  There is fat stranding around the sigmoid colon.  I personally evaluated the images.  Surgery is consulted by Dr. Leonides Schanz in this context for evaluation and management of perforated diverticulitis.  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     PAST SURGICAL HISTORY ([place]):  Past Surgical History:  Procedure Laterality Date   FOOT SURGERY Right      MEDICATIONS:  Prior to Admission medications   Not on File     ALLERGIES:  Allergies  Allergen Reactions   Shellfish Allergy Shortness Of Breath and Swelling   Gabapentin Nausea Only     SOCIAL HISTORY:  Social History   Socioeconomic History   Marital status: Divorced    Spouse name: Not on file   Number of children: Not on file   Years of education: Not on file   Highest education level: Not on file  Occupational History   Not on file  Tobacco Use   Smoking status: Every Day   Smokeless tobacco: Not on file  Substance and Sexual Activity   Alcohol use: No   Drug use: Not on file   Sexual activity: Not on file  Other Topics Concern   Not on file  Social History Narrative   Not on file   Social Determinants of Health   Financial Resource Strain: Not on file  Food Insecurity: Not on file  Transportation Needs: Not on file  Physical Activity: Not on file  Stress: Not on file  Social Connections: Not on file  Intimate Partner  Violence: Not on file      FAMILY HISTORY:  History reviewed. No pertinent family history.   REVIEW OF SYSTEMS:  Constitutional: denies weight loss, fever, chills, or sweats  Eyes: denies any other vision changes, history of eye injury  ENT: denies sore throat, hearing problems  Respiratory: denies shortness of breath, wheezing  Cardiovascular: denies chest pain, palpitations  Gastrointestinal: Positive abdominal pain, nausea and vomiting Genitourinary: denies burning with urination or urinary frequency Musculoskeletal: denies any other joint pains or cramps  Skin: denies any other rashes or skin discolorations  Neurological: denies any other headache, dizziness, weakness  Psychiatric: denies any other depression, anxiety   All other review of systems were negative   VITAL SIGNS:  Temp:  [99.8 F (37.7 C)] 99.8 F (37.7 C) (11/04 0636) Pulse Rate:  [106] 106 (11/04 0636) Resp:  [20] 20 (11/04 0636) BP: (111)/(87) 111/87 (11/04 0636) SpO2:  [93 %] 93 % (11/04 0636)             INTAKE/OUTPUT:  This shift: No intake/output data recorded.  Last 2 shifts: @IOLAST2SHIFTS @   PHYSICAL EXAM:  Constitutional:  -- Normal body habitus  -- Awake, alert, and oriented x3  Eyes:  -- Pupils equally round and reactive to light  -- No scleral icterus  Ear, nose, and  throat:  -- No jugular venous distension  Pulmonary:  -- No crackles  -- Equal breath sounds bilaterally -- Breathing non-labored at rest Cardiovascular:  -- S1, S2 present  -- No pericardial rubs Gastrointestinal:  -- Abdomen soft, tender, distended, with guarding and rebound tenderness -- No abdominal masses appreciated, pulsatile or otherwise  Musculoskeletal and Integumentary:  -- Wounds: None appreciated -- Extremities: B/L UE and LE FROM, hands and feet warm, no edema  Neurologic:  -- Motor function: intact and symmetric -- Sensation: intact and symmetric   Labs:  CBC Latest Ref Rng & Units 07/16/2021  09/21/2015 07/14/2014  WBC 4.0 - 10.5 K/uL 11.4(H) 15.8(H) 13.5(H)  Hemoglobin 13.0 - 17.0 g/dL 44.0 10.2 72.5  Hematocrit 39.0 - 52.0 % 47.8 47.3 40.9  Platelets 150 - 400 K/uL 198 330 265   CMP Latest Ref Rng & Units 07/16/2021 09/21/2015 07/14/2014  Glucose 70 - 99 mg/dL 366(Y) 97 96  BUN 6 - 20 mg/dL 17 12 40(H)  Creatinine 0.61 - 1.24 mg/dL 4.74(Q) 5.95 6.38(V)  Sodium 135 - 145 mmol/L 133(L) 138 139  Potassium 3.5 - 5.1 mmol/L 4.0 4.0 4.3  Chloride 98 - 111 mmol/L 98 105 104  CO2 22 - 32 mmol/L 25 24 27   Calcium 8.9 - 10.3 mg/dL 8.9 9.6 8.7  Total Protein 6.5 - 8.1 g/dL 7.5 - -  Total Bilirubin 0.3 - 1.2 mg/dL 0.8 - -  Alkaline Phos 38 - 126 U/L 55 - -  AST 15 - 41 U/L 34 - -  ALT 0 - 44 U/L 49(H) - -    Imaging studies:  EXAM: CT ABDOMEN AND PELVIS WITHOUT CONTRAST   TECHNIQUE: Multidetector CT imaging of the abdomen and pelvis was performed following the standard protocol without IV contrast.   COMPARISON:  July 15, 2014.   FINDINGS: Lower chest: Nodular density at the RIGHT lung base (image 1/2) this measures approximately 8 x 7 mm but is incompletely imaged. Basilar atelectasis admixed in this region. No effusion. No dense consolidative process.   Hepatobiliary: Hepatic steatosis. No visible discrete lesion. Steatosis at least moderate. No pericholecystic stranding.   Pancreas: Pancreas with smooth contours, no signs of inflammation.   Spleen: Spleen normal size and contour.   Adrenals/Urinary Tract: Adrenal glands are normal. Smooth contour the bilateral kidneys. No hydronephrosis. No perivesical stranding or perinephric stranding.   Stomach/Bowel: No perigastric stranding. No gastric distension. No small bowel obstruction.   Appendix is normal.   Proximal colon is fluid-filled.   Sigmoid colon at the descending sigmoid junction with thickening and small locules of pneumoperitoneum surrounding the colon in this location mixed with stranding. Free  intraperitoneal air tracks beneath the RIGHT hemidiaphragm as well and about the liver. Scattered locules of free air seen in the LEFT lower quadrant away from the area perforated diverticulitis.   Vascular/Lymphatic:   Aortic atherosclerosis. No sign of aneurysm. Smooth contour of the IVC. There is no gastrohepatic or hepatoduodenal ligament lymphadenopathy. No retroperitoneal or mesenteric lymphadenopathy.   No pelvic sidewall lymphadenopathy.   Reproductive: Unremarkable   Other: Free air as above.  No abscess.  No ascites.   Musculoskeletal: No acute or destructive bone finding.   IMPRESSION: Signs of perforated diverticulitis with pneumoperitoneum tracking into the LEFT lower quadrant small bowel mesentery and beneath the RIGHT and LEFT hemidiaphragm.   Nodular density at the RIGHT lung base this measures approximately 8 x 7 mm but is incompletely imaged. Basilar atelectasis admixed in this region. Given that  this is on the first image of the CT exam would suggest follow-up chest CT for more complete characterization when the patient is able or in 4-6 weeks.   Hepatic steatosis.   Aortic Atherosclerosis (ICD10-I70.0).   Critical Value/emergent results were called by telephone at the time of interpretation on 07/16/2021 at 6:27 am to provider [facility] , who verbally acknowledged these results. At that time small nodule in the LEFT lower lobe was discussed as well.     Electronically Signed   By: Zetta Bills M.D.   On: 07/16/2021 06:28  Assessment/Plan:  45 y.o. male with perforated diverticulitis.  Patient with clinical, imaging and physical exam consistent with perforated diverticulitis.  Patient with acute abdomen.  Severe tenderness to palpation.  Patient is tachycardic with adequate blood pressures.  Due to the severity of the pain and images consistent with free air I will recommend to proceed to the operating room emergently.  Patient oriented about  the surgery of partial sigmoid colectomy with end colostomy creation.  Patient to be peritoneal lavage.  Patient oriented about the risk of surgery include abscess formation, bleeding, infection, injury to adjacent organ, complication of ostomy, pain, among others.  The patient reported he understood and agreed to proceed.   Arnold Long, MD

## 2021-07-16 NOTE — Plan of Care (Signed)
Continuing with plan of care. 

## 2021-07-16 NOTE — ED Triage Notes (Signed)
Pt arrived via [facility] from home with c/o lower left abdominal pain x3 days that radiates into back. Hx/o kidney stones as well as diverticulitis. Pt nauseous but denies emesis. 4mg  Zofran IV given in route to ED by EMS.

## 2021-07-16 NOTE — TOC CM/SW Note (Signed)
[facility]ified [facility] that [facility] been [facility]. Auth # [id number].  Charlynn Court, CSW [phone]

## 2021-07-16 NOTE — Transfer of Care (Addendum)
Immediate Anesthesia Transfer of Care Note  Patient: Christopher Espinoza  Procedure(s) Performed: EXPLORATORY LAPAROTOMY COLON RESECTION SIGMOID COLOSTOMY  Patient Location: OR  Anesthesia Type:General  Level of Consciousness: awake, alert , oriented and patient cooperative  Airway & Oxygen Therapy: Patient Spontanous Breathing and Patient connected to face mask oxygen  Post-op Assessment: Report given to RN and Post -op Vital signs reviewed and stable  Post vital signs: Reviewed and stable  Last Vitals:  Vitals Value Taken Time  BP    Temp    Pulse    Resp    SpO2      Last Pain:  Vitals:   07/16/21 0636  TempSrc: Oral         Complications: No notable events documented.

## 2021-07-17 ENCOUNTER — Encounter: Payer: Self-pay | Admitting: General Surgery

## 2021-07-17 LAB — CBC
HCT: 39.3 % (ref 39.0–52.0)
Hemoglobin: 13.5 g/dL (ref 13.0–17.0)
MCH: 32 pg (ref 26.0–34.0)
MCHC: 34.4 g/dL (ref 30.0–36.0)
MCV: 93.1 fL (ref 80.0–100.0)
Platelets: 184 10*3/uL (ref 150–400)
RBC: 4.22 MIL/uL (ref 4.22–5.81)
RDW: 13.2 % (ref 11.5–15.5)
WBC: 12.1 10*3/uL — ABNORMAL HIGH (ref 4.0–10.5)
nRBC: 0 % (ref 0.0–0.2)

## 2021-07-17 LAB — BASIC METABOLIC PANEL
Anion gap: 7 (ref 5–15)
BUN: 16 mg/dL (ref 6–20)
CO2: 26 mmol/L (ref 22–32)
Calcium: 7.7 mg/dL — ABNORMAL LOW (ref 8.9–10.3)
Chloride: 104 mmol/L (ref 98–111)
Creatinine, Ser: 0.81 mg/dL (ref 0.61–1.24)
GFR, Estimated: >60 mL/min (ref >60)
Glucose, Bld: 122 mg/dL — ABNORMAL HIGH (ref 70–99)
Potassium: 3.9 mmol/L (ref 3.5–5.1)
Sodium: 137 mmol/L (ref 135–145)

## 2021-07-17 MED ORDER — KETOROLAC TROMETHAMINE 30 MG/ML IJ SOLN
30.0000 mg | Freq: Four times a day (QID) | INTRAMUSCULAR | Status: DC
  Administered 2021-07-17 – 2021-07-21 (×15): 30 mg via INTRAVENOUS
  Filled 2021-07-17 (×16): qty 1

## 2021-07-17 NOTE — Progress Notes (Signed)
Patient ID: Christopher Espinoza, male   DOB: 1976/06/11, 45 y.o.   MRN: [id number]     SURGICAL PROGRESS NOTE   Hospital Day(s): 1.   Interval History: Patient seen and examined, no acute events or new complaints overnight. Patient reports feeling uncomfortable.  Still with significant pain in surgical area.  Vital signs in last 24 hours: [min-max] current  Temp:  [97 F (36.1 C)-99.2 F (37.3 C)] 98.5 F (36.9 C) (11/05 0917) Pulse Rate:  [64-92] 92 (11/05 0917) Resp:  [14-20] 20 (11/05 0917) BP: (93-150)/(63-94) 150/94 (11/05 0917) SpO2:  [92 %-97 %] 92 % (11/05 0917)     Height: 5\' 10"  (177.8 cm) Weight: 88.5 kg BMI (Calculated): 27.98   Physical Exam:  Constitutional: alert, cooperative and no distress  Respiratory: breathing non-labored at rest  Cardiovascular: regular rate and sinus rhythm  Gastrointestinal: soft, mild-tender, and non-distended.  Colostomy pink and patent  Labs:  CBC Latest Ref Rng & Units 07/16/2021 09/21/2015 07/14/2014  WBC 4.0 - 10.5 K/uL 11.4(H) 15.8(H) 13.5(H)  Hemoglobin 13.0 - 17.0 g/dL 13/10/2013 39.7 67.3  Hematocrit 39.0 - 52.0 % 47.8 47.3 40.9  Platelets 150 - 400 K/uL 198 330 265   CMP Latest Ref Rng & Units 07/16/2021 09/21/2015 07/14/2014  Glucose 70 - 99 mg/dL 13/10/2013) 97 96  BUN 6 - 20 mg/dL 17 12 379(K)  Creatinine 0.61 - 1.24 mg/dL 24(O) 9.73(Z 3.29)  Sodium 135 - 145 mmol/L 133(L) 138 139  Potassium 3.5 - 5.1 mmol/L 4.0 4.0 4.3  Chloride 98 - 111 mmol/L 98 105 104  CO2 22 - 32 mmol/L 25 24 27   Calcium 8.9 - 10.3 mg/dL 8.9 9.6 8.7  Total Protein 6.5 - 8.1 g/dL 7.5 - -  Total Bilirubin 0.3 - 1.2 mg/dL 0.8 - -  Alkaline Phos 38 - 126 U/L 55 - -  AST 15 - 41 U/L 34 - -  ALT 0 - 44 U/L 49(H) - -    Imaging studies: No new pertinent imaging studies   Assessment/Plan:  45 y.o. male with perforated diverticulitis 1 Day Post-Op s/p sigmoid colectomy with end colostomy creation.   Patient today significantly uncomfortable.  No fever, tachycardia.  Still  with significant pain.  Will check labs for renal function to see if we can add Toradol.  Will discontinue Foley catheter.  Will clamp NGT and try ice chips and sip of water.  We will continue IV antibiotic therapy due to purulent peritonitis identified during surgery.  Encourage the patient to get out of bed.  Date of COVID-19 test from admission protocol, asymptomatic.  , MD

## 2021-07-17 NOTE — Plan of Care (Signed)
Continuing with plan of care. 

## 2021-07-18 NOTE — Consult Note (Signed)
WOC Nurse ostomy consult note Consult received for new colostomy created emergently on 07/16/21. WOC nurse to see on Monday, 07/19/21.  WOC nursing team will follow, and will remain available to this patient, the nursing and medical teams.  Thanks, Ladona Mow, MSN, RN, GNP, [place]  Pager# [phone]

## 2021-07-18 NOTE — TOC Initial Note (Signed)
Transition of Care [facility]) - Initial/Assessment Note    Patient Details  Name: Christopher Espinoza MRN: [id number] Date of Birth: 1976-05-06  [facility] of [facility]) CM/SW Contact:    Liliana Cline, LCSW Phone Number: 07/18/2021, 2:38 PM  Clinical Narrative:           CSW spoke with patient regarding DC planning. Patient lives with family. At baseline drives himself to appointments, says his family can also provide transportation. PCP and Pharmacy is [facility]. Patient agreeable to HHPT and RW recs. Patient with new colostomy, will also need HHRN. Referral to [facility]. Confirmed home address. Patient asking about medical follow up at the [place] after DC. CSW sent encrypted email to Wonda Cerise with [facility] requesting approval for RW and [facility] services as well as requesting someone from the [place] follow up with patient about his questions. Included weekday CSW on email.   Expected Discharge Plan: Home w Home Health Services Barriers to Discharge: Continued Medical Work up, English as a second language teacher   Patient Goals and CMS Choice Patient states their goals for this hospitalization and ongoing recovery are:: home with home health CMS Medicare.gov Compare Post Acute Care list provided to:: Patient Choice offered to / list presented to : Patient  Expected Discharge Plan and Services Expected Discharge Plan: Home w Home Health Services       Living arrangements for the past 2 months: Single Family Home                 DME Arranged: Walker rolling DME Agency: [facility], [place] Date DME Agency Contacted: 07/18/21   Representative spoke with at DME Agency: Wonda Cerise - [facility] approval pending HH Arranged: PT, RN HH Agency: [facility] Date HH Agency Contacted: 07/18/21   Representative spoke with at [facility] Agency: Feliberto Gottron - VA approval pending  Prior Living Arrangements/Services Living arrangements for the past 2 months: Single Family Home Lives with::  Relatives Patient language and need for interpreter reviewed:: Yes Do you feel safe going back to the place where you live?: Yes      Need for Family Participation in Patient Care: Yes (Comment) Care giver support system in place?: Yes (comment)   Criminal Activity/Legal Involvement Pertinent to Current Situation/Hospitalization: No - Comment as needed  Activities of Daily Living Home Assistive Devices/Equipment: None ADL Screening (condition at time of admission) Patient's cognitive ability adequate to safely complete daily activities?: Yes Is the patient deaf or have difficulty hearing?: No Does the patient have difficulty seeing, even when wearing glasses/contacts?: No Does the patient have difficulty concentrating, remembering, or making decisions?: No Patient able to express need for assistance with ADLs?: No Does the patient have difficulty dressing or bathing?: No Independently performs ADLs?: Yes (appropriate for developmental age) Does the patient have difficulty walking or climbing stairs?: No Weakness of Legs: None Weakness of Arms/Hands: None  Permission Sought/Granted Permission sought to share information with : Oceanographer granted to share information with : Yes, Verbal Permission Granted     Permission granted to share info w AGENCY: HH agencies, [employer] [place]        Emotional Assessment       Orientation: : Oriented to Place, Oriented to Self, Oriented to  Time, Oriented to Situation Alcohol / Substance Use: Not Applicable Psych Involvement: No (comment)  Admission diagnosis:  Bowel perforation (HCC) [K63.1] Pulmonary nodule [R91.1] Diverticulitis of colon with perforation [K57.20] Patient Active Problem List  Diagnosis Date Noted   Diverticulitis of colon with perforation 07/16/2021   PCP:  [facility], [employer]  No Pharmacies Listed    Social Determinants of Health (SDOH) Interventions    Readmission  Risk Interventions No flowsheet data found.

## 2021-07-18 NOTE — Progress Notes (Signed)
Patient ID: Christopher Espinoza, male   DOB: July 20, 1976, 45 y.o.   MRN: [id number]     SURGICAL PROGRESS NOTE   Hospital Day(s): 2.   Interval History: Patient seen and examined, no acute events or new complaints overnight. Patient reports he has been having a lot of gas to the colostomy.  Nausea improved.  Pain better controlled.  Vital signs in last 24 hours: [min-max] current  Temp:  [98.1 F (36.7 C)-98.5 F (36.9 C)] 98.2 F (36.8 C) (11/06 0836) Pulse Rate:  [79-92] 85 (11/06 0836) Resp:  [16-20] 16 (11/06 0836) BP: (110-150)/(77-94) 115/81 (11/06 0836) SpO2:  [92 %-94 %] 92 % (11/06 0836)     Height: 5\' 10"  (177.8 cm) Weight: 88.5 kg BMI (Calculated): 27.98   Physical Exam:  Constitutional: alert, cooperative and no distress  Respiratory: breathing non-labored at rest  Cardiovascular: regular rate and sinus rhythm  Gastrointestinal: soft, non-tender, and non-distended.  Colostomy pink and patent  Labs:  CBC Latest Ref Rng & Units 07/17/2021 07/16/2021 09/21/2015  WBC 4.0 - 10.5 K/uL 12.1(H) 11.4(H) 15.8(H)  Hemoglobin 13.0 - 17.0 g/dL 11/19/2015 85.2 77.8  Hematocrit 39.0 - 52.0 % 39.3 47.8 47.3  Platelets 150 - 400 K/uL 184 198 330   CMP Latest Ref Rng & Units 07/17/2021 07/16/2021 09/21/2015  Glucose 70 - 99 mg/dL 11/19/2015) 353(I) 97  BUN 6 - 20 mg/dL 16 17 12   Creatinine 0.61 - 1.24 mg/dL 144(R ) 1.54  Sodium 135 - 145 mmol/L 137 133(L) 138  Potassium 3.5 - 5.1 mmol/L 3.9 4.0 4.0  Chloride 98 - 111 mmol/L 104 98 105  CO2 22 - 32 mmol/L 26 25 24   Calcium 8.9 - 10.3 mg/dL 7.7(L) 8.9 9.6  Total Protein 6.5 - 8.1 g/dL - 7.5 -  Total Bilirubin 0.3 - 1.2 mg/dL - 0.8 -  Alkaline Phos 38 - 126 U/L - 55 -  AST 15 - 41 U/L - 34 -  ALT 0 - 44 U/L - 49(H) -    Imaging studies: No new pertinent imaging studies   Assessment/Plan:  45 y.o. male with perforated diverticulitis 2 Day Post-Op s/p sigmoid colectomy with end colostomy creation.   Today patient passing gas through colostomy.   Feeling more comfortable.  I will clamp again the NGT and try critically diet.  I encouraged the patient to ambulate.  Consulted wound care for colostomy orientation and coordination tomorrow.  We will continue with pain management.  We will continue with DVT phylaxis we will continue with IV antibiotic therapy due to purulent peritonitis during surgery.  7.61, MD

## 2021-07-18 NOTE — Evaluation (Addendum)
Physical Therapy Evaluation Patient Details Name: Christopher Espinoza MRN: [id number] DOB: [birth year] Today's Date: 07/18/2021  History of Present Illness  Pt is a 45 y/o M admitted on 07/16/21 after presenting from home with c/o L lower abdominal pain x 3 days that radiates to his back. Pt was found to have an acute perforation of the sigmoid colon & an emergent resection was indicated. Pt is now POD 2 s/p sigmod colectomy with end colostomy creation. PMH: neuropathy, diverticulitis  Clinical Impression  Pt seen for PT evaluation with pt reporting he was independent without AD prior to admission. On this date, pt requires supervision but heavy reliance on hospital bed features & extra time to complete supine>sit, min assist fade to CGA for transfers with educational cuing for hand placement, and CGA for short distance gait in room. Pt with global weakness & heavy reliance on RW with impaired gait pattern as noted below. Pt also received on room air with SpO2 87-88%, pt placed on 1L with improvement to >90%. After transferring to recliner attempted room air again with pt able to maintain >90% until after gait when pt dropped to 87% so pt left on 1L/min at 90% at end of session. Will continue to follow pt acutely to progress endurance, strength & gait with LRAD.   (Nurse made aware of O2 requirements during session.)     Recommendations for follow up therapy are one component of a multi-disciplinary discharge planning process, led by the attending physician.  Recommendations may be updated based on patient status, additional functional criteria and insurance authorization.  Follow Up Recommendations Home health PT    Assistance Recommended at Discharge Frequent or constant Supervision/Assistance  Functional Status Assessment Patient has had a recent decline in their functional status and demonstrates the ability to make significant improvements in function in a reasonable and predictable amount of time.   Equipment Recommendations  Rolling walker (2 wheels)    Recommendations for Other Services       Precautions / Restrictions Precautions Precautions: Fall Precaution Comments: R side JP drain, L colostomy Restrictions Weight Bearing Restrictions: No      Mobility  Bed Mobility Overal bed mobility: Needs Assistance Bed Mobility: Supine to Sit     Supine to sit: Supervision;HOB elevated     General bed mobility comments: extra time & cuing to move LLE to EOB, use of bed rails    Transfers Overall transfer level: Needs assistance Equipment used: Rolling walker (2 wheels) Transfers: Sit to/from [employer] Transfers Sit to Stand: Min assist;Min guard Stand pivot transfers: Min guard         General transfer comment: cuing for safe hand placement when using RW    Ambulation/Gait Ambulation/Gait assistance: Min guard Gait Distance (Feet): 20 Feet Assistive device: Rolling walker (2 wheels) Gait Pattern/deviations: Decreased step length - right;Decreased step length - left;Decreased stride length;Decreased dorsiflexion - right;Decreased dorsiflexion - left;Trunk flexed Gait velocity: decreased   General Gait Details: heavy forward lean on RW with max cuing for upright posture & to ambulate within base of AD, decreased heel strike BLE  Stairs            Wheelchair Mobility    Modified Rankin (Stroke Patients Only)       Balance Overall balance assessment: Needs assistance Sitting-balance support: Bilateral upper extremity supported;Feet supported Sitting balance-Leahy Scale: Good Sitting balance - Comments: close supervision static sitting   Standing balance support: Single extremity supported;During functional activity Standing balance-Leahy Scale: Fair Standing balance  comment: at least 1 UE support on RW & CGA                             Pertinent Vitals/Pain Pain Assessment: Faces Faces Pain Scale: Hurts a little bit Pain  Location: abdomen, pt also endorses chronic R foot neuropathic pain Pain Descriptors / Indicators: Discomfort;Grimacing;Guarding Pain Intervention(s): Limited activity within patient's tolerance;Monitored during session    Home Living Family/patient expects to be discharged to:: Private residence Living Arrangements:  (brother & sister-in-law) Available Help at Discharge: Family;Available PRN/intermittently Type of Home: House Home Access: Stairs to enter Entrance Stairs-Rails: Right;Left;Can reach both Entrance Stairs-Number of Steps: 4   Home Layout: One level Home Equipment: None      Prior Function Prior Level of Function : Independent/Modified Independent             Mobility Comments: ambulatory without AD, 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, a veteran)       Higher education careers adviser        Extremity/Trunk Assessment   Upper Extremity Assessment Upper Extremity Assessment: Generalized weakness    Lower Extremity Assessment Lower Extremity Assessment: Generalized weakness       Communication      Cognition Arousal/Alertness: Awake/alert Behavior During Therapy: WFL for tasks assessed/performed;Flat affect Overall Cognitive Status: Within Functional Limits for tasks assessed                                          General Comments General comments (skin integrity, edema, etc.): Pt with continent void while standing with urinal, L IV site bleeding & nurse notified & assessed during session    Exercises     Assessment/Plan    PT Assessment Patient needs continued PT services  PT Problem List Decreased strength;Decreased mobility;Decreased activity tolerance;Decreased balance;Decreased knowledge of use of DME;Pain;Cardiopulmonary status limiting activity       PT Treatment Interventions DME instruction;Therapeutic activities;Modalities;Gait training;Therapeutic exercise;Patient/family education;Stair  training;Balance training;Functional mobility training;Manual techniques;Neuromuscular re-education    PT Goals (Current goals can be found in the Care Plan section)  Acute Rehab PT Goals Patient Stated Goal: walk PT Goal Formulation: With patient Time For Goal Achievement: 08/01/21 Potential to Achieve Goals: Good    Frequency Min 2X/week   Barriers to discharge        Co-evaluation               AM-PAC PT "6 Clicks" Mobility  Outcome Measure Help needed turning from your back to your side while in a flat bed without using bedrails?: A Little Help needed moving from lying on your back to sitting on the side of a flat bed without using bedrails?: A Little Help needed moving to and from a bed to a chair (including a wheelchair)?: A Little Help needed standing up from a chair using your arms (e.g., wheelchair or bedside chair)?: A Little Help needed to walk in hospital room?: A Little Help needed climbing 3-5 steps with a railing? : A Lot 6 Click Score: 17    End of Session Equipment Utilized During Treatment: Oxygen Activity Tolerance: Patient tolerated treatment well Patient left: in chair;with chair alarm set;with call bell/phone within reach;with nursing/sitter in room Nurse Communication: Mobility status (L IV site bleeding) PT Visit Diagnosis: Unsteadiness on feet (R26.81);Muscle weakness (generalized) (M62.81);Difficulty in walking, not  elsewhere classified (R26.2)    Time: 1610-9604 PT Time Calculation (min) (ACUTE ONLY): 33 min   Charges:   PT Evaluation $PT Eval Moderate Complexity: 1 Mod PT Treatments $Therapeutic Activity: 23-37 mins        Aleda Grana, PT, DPT 07/18/21, 1:41 PM   Sandi Mariscal 07/18/2021, 1:39 PM

## 2021-07-18 NOTE — Plan of Care (Signed)
Continuing with plan of care. 

## 2021-07-19 LAB — SURGICAL PATHOLOGY

## 2021-07-19 MED ORDER — ALUM & MAG HYDROXIDE-SIMETH 200-200-20 MG/5ML PO SUSP
30.0000 mL | Freq: Three times a day (TID) | ORAL | Status: DC | PRN
  Administered 2021-07-20 (×2): 30 mL via ORAL
  Filled 2021-07-19 (×2): qty 30

## 2021-07-19 NOTE — Plan of Care (Signed)
  Problem: Education: Goal: Knowledge of General Education information will improve Description: Including pain rating scale, medication(s)/side effects and non-pharmacologic comfort measures Outcome: Progressing   Problem: Health Behavior/Discharge Planning: Goal: Ability to manage health-related needs will improve Outcome: Progressing   Problem: Clinical Measurements: Goal: Ability to maintain clinical measurements within normal limits will improve Outcome: Progressing   Problem: Activity: Goal: Risk for activity intolerance will decrease Outcome: Progressing   Problem: Elimination: Goal: Will not experience complications related to bowel motility Outcome: Progressing Goal: Will not experience complications related to urinary retention Outcome: Progressing   Problem: Pain Managment: Goal: General experience of comfort will improve Outcome: Progressing   Problem: Safety: Goal: Ability to remain free from injury will improve Outcome: Progressing   Problem: Skin Integrity: Goal: Risk for impaired skin integrity will decrease Outcome: Progressing   

## 2021-07-19 NOTE — Progress Notes (Signed)
Patient ID: Christopher Espinoza, male   DOB: Mar 05, 1976, 45 y.o.   MRN: [id number]     SURGICAL PROGRESS NOTE   Hospital Day(s): 3.   Interval History: Patient seen and examined, no acute events or new complaints overnight. Patient reports feeling well.  There is chest pain has been improved.  Denies any nausea or vomiting.  Endorses tolerating diet.  Vital signs in last 24 hours: [min-max] current  Temp:  [98.3 F (36.8 C)-98.6 F (37 C)] 98.3 F (36.8 C) (11/07 0854) Pulse Rate:  [79-94] 94 (11/07 0854) Resp:  [16-20] 16 (11/07 0854) BP: (129-158)/(88-100) 158/100 (11/07 0854) SpO2:  [92 %] 92 % (11/07 0854)     Height: 5\' 10"  (177.8 cm) Weight: 88.5 kg BMI (Calculated): 27.98   Physical Exam:  Constitutional: alert, cooperative and no distress  Respiratory: breathing non-labored at rest  Cardiovascular: regular rate and sinus rhythm  Gastrointestinal: soft, non-tender, and non-distended.  Colostomy pink patent  Labs:  CBC Latest Ref Rng & Units 07/17/2021 07/16/2021 09/21/2015  WBC 4.0 - 10.5 K/uL 12.1(H) 11.4(H) 15.8(H)  Hemoglobin 13.0 - 17.0 g/dL 11/19/2015 40.9 81.1  Hematocrit 39.0 - 52.0 % 39.3 47.8 47.3  Platelets 150 - 400 K/uL 184 198 330   CMP Latest Ref Rng & Units 07/17/2021 07/16/2021 09/21/2015  Glucose 70 - 99 mg/dL 11/19/2015) 782(N) 97  BUN 6 - 20 mg/dL 16 17 12   Creatinine 0.61 - 1.24 mg/dL 562(Z ) 3.08  Sodium 135 - 145 mmol/L 137 133(L) 138  Potassium 3.5 - 5.1 mmol/L 3.9 4.0 4.0  Chloride 98 - 111 mmol/L 104 98 105  CO2 22 - 32 mmol/L 26 25 24   Calcium 8.9 - 10.3 mg/dL 7.7(L) 8.9 9.6  Total Protein 6.5 - 8.1 g/dL - 7.5 -  Total Bilirubin 0.3 - 1.2 mg/dL - 0.8 -  Alkaline Phos 38 - 126 U/L - 55 -  AST 15 - 41 U/L - 34 -  ALT 0 - 44 U/L - 49(H) -    Imaging studies: No new pertinent imaging studies   Assessment/Plan:  45 y.o. male with perforated diverticulitis 3 Day Post-Op s/p sigmoid colectomy with end colostomy creation.  Patient recovering adequately.  No fever.   No tachycardia.  Pain slowly improving.  Colostomy is working.  I will advance diet to soft diet.  Patient received indication of colostomy care.  We will start ordering home health for wound care and PT.  I encouraged the patient to ambulate.  Possible discharge within 24 to 48 hours.  4.69, MD

## 2021-07-19 NOTE — TOC Progression Note (Addendum)
Transition of Care [facility]) - Progression Note    Patient Details  Name: Christopher Espinoza MRN: [id number] Date of Birth: 02-Aug-1976  [facility] of [facility]) CM/SW Contact  Margarito Liner, LCSW Phone Number: 07/19/2021, 10:25 AM  Clinical Narrative:  Sent records to [facility] hospital liaison. Sent secure chat to MD requesting home health and DME orders. Will send to [facility] hospital liaison for processing once available.   4:04 pm: Sent HH and DME orders to [facility] hospital liaison.  4:29 pm: Patient's PCP is on sick leave so [facility] will have to find a different provider that is willing to sign off on his home health. Will need to send patient home with a few days worth of colostomy supplies so they have time to order anything that is needed through [place] pharmacy after that provider reviews the clinical referral. The walker has been ordered. Patient would have to go pick it up at the [facility] for immediate use or it will be mailed in 5-7 days.  Expected Discharge Plan: Home w Home Health Services Barriers to Discharge: Continued Medical Work up, English as a second language teacher  Expected Discharge Plan and Services Expected Discharge Plan: Home w Home Health Services       Living arrangements for the past 2 months: Single Family Home                 DME Arranged: Walker rolling DME Agency: [facility], [place] Date DME Agency Contacted: 07/18/21   Representative spoke with at DME Agency: Wonda Cerise - [facility] approval pending HH Arranged: PT, RN HH Agency: [facility] Date HH Agency Contacted: 07/18/21   Representative spoke with at [facility] Agency: Feliberto Gottron - [facility] approval pending   Social Determinants of Health (SDOH) Interventions    Readmission Risk Interventions No flowsheet data found.

## 2021-07-19 NOTE — Consult Note (Addendum)
WOC Nurse ostomy consult note Pt had colostomy surgery performed 11/4.  He is in isolation for Covid and states he does not have any family members that will be assisting with ostomy care.  Stoma type/location: Stoma is red and viable, flush with skin level, 1 1/2 inches and oval. Peristomal assessment: intact skin surrounding, current pouch is leaking behind the barrier.  Output: 50cc liquid brown stool Ostomy pouching: Applied barrier ring and 2 piece pouch.  Ordered 5 sets of supplies to the bedside for use and will use one piece flexible convex pouches next time. Pt watched the procedure and asked appropriate questions. Education provided: Demonstrated pouch change and added barrier ring to attempt to maintain a seal.  Pt was able to assist and can open and close velcro to empty.  Discussed pouching routines and ordering supplies. Educational materials left in the room. Use supplies: barrier ring [employer] [place] # [phone]) and flexible convex pouches [employer] [place] # [phone]) Enrolled patient in [facility] DC program: Yes WOC team will plan to perform another teaching session on Wed. [facility]  Thank-you,  Cammie Mcgee MSN, RN, CWOCN, Marceline, CNS [phone]

## 2021-07-20 LAB — BASIC METABOLIC PANEL
Anion gap: 9 (ref 5–15)
BUN: 17 mg/dL (ref 6–20)
CO2: 23 mmol/L (ref 22–32)
Calcium: 8.4 mg/dL — ABNORMAL LOW (ref 8.9–10.3)
Chloride: 103 mmol/L (ref 98–111)
Creatinine, Ser: 0.85 mg/dL (ref 0.61–1.24)
GFR, Estimated: >60 mL/min (ref >60)
Glucose, Bld: 105 mg/dL — ABNORMAL HIGH (ref 70–99)
Potassium: 3.8 mmol/L (ref 3.5–5.1)
Sodium: 135 mmol/L (ref 135–145)

## 2021-07-20 LAB — CBC
HCT: 38.1 % — ABNORMAL LOW (ref 39.0–52.0)
Hemoglobin: 13.5 g/dL (ref 13.0–17.0)
MCH: 32.2 pg (ref 26.0–34.0)
MCHC: 35.4 g/dL (ref 30.0–36.0)
MCV: 90.9 fL (ref 80.0–100.0)
Platelets: 260 10*3/uL (ref 150–400)
RBC: 4.19 MIL/uL — ABNORMAL LOW (ref 4.22–5.81)
RDW: 12.3 % (ref 11.5–15.5)
WBC: 10.3 10*3/uL (ref 4.0–10.5)
nRBC: 0 % (ref 0.0–0.2)

## 2021-07-20 MED ORDER — ONDANSETRON HCL 4 MG/2ML IJ SOLN
4.0000 mg | Freq: Four times a day (QID) | INTRAMUSCULAR | Status: DC | PRN
  Administered 2021-07-20 – 2021-07-21 (×3): 4 mg via INTRAVENOUS
  Filled 2021-07-20 (×3): qty 2

## 2021-07-20 MED ORDER — SODIUM CHLORIDE 0.9 % IV SOLN
25.0000 mg | Freq: Four times a day (QID) | INTRAVENOUS | Status: DC | PRN
  Administered 2021-07-20: 25 mg via INTRAVENOUS
  Filled 2021-07-20 (×3): qty 1

## 2021-07-20 NOTE — Progress Notes (Signed)
PT Cancellation Note  Patient Details Name: Christopher Espinoza MRN: [id number] DOB: July 11, 1976   Cancelled Treatment:    Reason Eval/Treat Not Completed: Patient declined, no reason specified. Orders received. Upon entry to room, pt declining need for PT, requesting to eat his lunch. Reports most difficulty right now is managing colostomy bag. PT will re-attempt later time/date as able.   Delphia Grates. Fairly IV, PT, DPT Physical Therapist- [place]  [facility]  07/20/2021, 1:34 PM

## 2021-07-20 NOTE — Progress Notes (Signed)
Called into the room by patient. Patient  found standing by the bed, reports he had vomited and that his colostomy bag had fallen off. Assisted patient to the chair. Cleansed patient, applied new gown. New 1 piece colostomy placed with wax ring. No leaks noted. Bed linen changed. Assisted patient back to the bed. Patient denies needs at this time. Call bell with in reach, instructed patient to call if he needs assistance or feel nauseated again.

## 2021-07-20 NOTE — Progress Notes (Signed)
Patient ID: Christopher Espinoza, male   DOB: May 30, 1976, 45 y.o.   MRN: [id number]     SURGICAL PROGRESS NOTE   Hospital Day(s): 4.   Interval History: Patient seen and examined, no acute events or new complaints overnight. Patient reports feeling nauseous.  He does understand that the colostomy is working adequately.  Vital signs in last 24 hours: [min-max] current  Temp:  [98.6 F (37 C)-99.1 F (37.3 C)] 99.1 F (37.3 C) (11/08 0310) Pulse Rate:  [80-89] 85 (11/08 0310) Resp:  [16-18] 18 (11/08 0310) BP: (147-154)/(84-92) 147/92 (11/08 0310) SpO2:  [92 %-94 %] 92 % (11/08 0310)     Height: 5\' 10"  (177.8 cm) Weight: 88.5 kg BMI (Calculated): 27.98   Physical Exam:  Constitutional: alert, cooperative and no distress  Respiratory: breathing non-labored at rest  Cardiovascular: regular rate and sinus rhythm  Gastrointestinal: soft, non-tender, and non-distended.  Ostomy pink and patent with liquid stool.  Labs:  CBC Latest Ref Rng & Units 07/17/2021 07/16/2021 09/21/2015  WBC 4.0 - 10.5 K/uL 12.1(H) 11.4(H) 15.8(H)  Hemoglobin 13.0 - 17.0 g/dL 11/19/2015 62.6 94.8  Hematocrit 39.0 - 52.0 % 39.3 47.8 47.3  Platelets 150 - 400 K/uL 184 198 330   CMP Latest Ref Rng & Units 07/17/2021 07/16/2021 09/21/2015  Glucose 70 - 99 mg/dL 11/19/2015) 270(J) 97  BUN 6 - 20 mg/dL 16 17 12   Creatinine 0.61 - 1.24 mg/dL 500(X ) 3.81  Sodium 135 - 145 mmol/L 137 133(L) 138  Potassium 3.5 - 5.1 mmol/L 3.9 4.0 4.0  Chloride 98 - 111 mmol/L 104 98 105  CO2 22 - 32 mmol/L 26 25 24   Calcium 8.9 - 10.3 mg/dL 7.7(L) 8.9 9.6  Total Protein 6.5 - 8.1 g/dL - 7.5 -  Total Bilirubin 0.3 - 1.2 mg/dL - 0.8 -  Alkaline Phos 38 - 126 U/L - 55 -  AST 15 - 41 U/L - 34 -  ALT 0 - 44 U/L - 49(H) -    Imaging studies: No new pertinent imaging studies   Assessment/Plan:  45 y.o. male with perforated diverticulitis 4 Day Post-Op s/p sigmoid colectomy with end colostomy creation.  Patient today not feeling as good as yesterday.  He  was nauseous.  No vomiting.  Colostomy working adequately.  There was adequate amount of liquid stool.  We will do some labs to see if there is any electrolyte disturbance or any other abnormality that can explain that he is feeling nauseated today.  The abdomen physical exam is benign.  We will continue with soft diet.  I will add some antinausea medication.  I encouraged the patient to ambulate.  3.71, MD

## 2021-07-20 NOTE — TOC Progression Note (Addendum)
Transition of Care [facility]) - Progression Note    Patient Details  Name: Christopher Espinoza MRN: [id number] Date of Birth: 04-Apr-1976  [facility]) CM/SW Contact  Margarito Liner, LCSW Phone Number: 07/20/2021, 9:18 AM  Clinical Narrative:  Updated patient. He said it's okay if [facility] ships walker to his home within 5-7 days.   10:36 am: [facility] hospital liaison still looking for alternate PCP. Since none of their other providers have seen him, they will want to see him face-to-face before ordering services. Dr. Maia Plan is aware and is willing to cover [facility] orders until patient can be seen if the [facility] will allow that. Emailed [facility] hospital liaison to ask. [facility] will start services before being approved by [facility] if [facility] leadership will approve LOG.  2:37 pm: [facility] unable to schedule PCP appointment until they have discharge summary.  3:00 pm: Emailed LOG information to CMA at [facility] to be written up and sent to [facility]  Expected Discharge Plan: Home w Home Health Services Barriers to Discharge: Continued Medical Work up, English as a second language teacher  Expected Discharge Plan and Services Expected Discharge Plan: Home w Home Health Services       Living arrangements for the past 2 months: Single Family Home                 DME Arranged: Walker rolling DME Agency: [facility], [place] Date DME Agency Contacted: 07/18/21   Representative spoke with at DME Agency: Wonda Cerise - VA approval pending HH Arranged: PT, RN HH Agency: [facility] Date HH Agency Contacted: 07/18/21   Representative spoke with at [facility] Agency: Feliberto Gottron - VA approval pending   Social Determinants of Health (SDOH) Interventions    Readmission Risk Interventions No flowsheet data found.

## 2021-07-20 NOTE — Plan of Care (Signed)
  Problem: Clinical Measurements: Goal: Ability to maintain clinical measurements within normal limits will improve Outcome: Progressing Goal: Will remain free from infection Outcome: Progressing Goal: Diagnostic test results will improve Outcome: Progressing Goal: Respiratory complications will improve Outcome: Progressing Goal: Cardiovascular complication will be avoided Outcome: Progressing   Problem: Coping: Goal: Level of anxiety will decrease Outcome: Progressing   Problem: Pain Managment: Goal: General experience of comfort will improve Outcome: Progressing   Pt is involved in and agrees with the plan of care. V/S stable. Complained of minimal pain on his abdomen. Scheduled toradol given. Reports heartburn; mylanta given.  Increased output from colostomy noted; watery brownish stool drained. JP drain inplace.

## 2021-07-21 LAB — CULTURE, BLOOD (ROUTINE X 2)
Culture: NO GROWTH
Culture: NO GROWTH
Special Requests: ADEQUATE
Special Requests: ADEQUATE

## 2021-07-21 MED ORDER — HYDROCODONE-ACETAMINOPHEN 5-325 MG PO TABS: 1.0000 | ORAL_TABLET | ORAL | 0 refills | Status: AC | PRN

## 2021-07-21 MED ORDER — ONDANSETRON HCL 4 MG PO TABS: 4.0000 mg | ORAL_TABLET | Freq: Every day | ORAL | 1 refills | Status: AC | PRN

## 2021-07-21 NOTE — Consult Note (Signed)
WOC Nurse ostomy consult note Stoma type/location: LLQ end colostomy, flush with creasing at 3 and 9 o'clock.  Stomal assessment/size:  1 1/4" pink and moist  green effluent present in pouch Peristomal assessment: creasing at 3 and 9 o'clock, flush stoma  midline staple line intact Treatment options for stomal/peristomal skin: barrier ring and 1 piece convex pouch.  Output liquid green effluent  Ostomy pouching: 1pc.convex with barrier ring. Discuss ostomy belt and that this may improve pouch security as he becomes more active. Due to abdominal tenderness, he does not wish to try belt at this time.  I will request a belt with secure start.  [facility] will provide supplies and [facility] after discharge.   Education provided: We perform pouch change.  Remove old pouch and cleanse skin.  I explain rationale for barrier ring and place around stoma. We measure stoma and patient cuts pouch to fit 1 1/4" .  We discuss warming pouch to promote adherence.  TWice weekly pouch changes, showering and rationale for filtered pouch.  Has 4 additional pouch sets at bedside to take home with him.   I have provided a 2 piece pouch system and briefly discuss this system with him. I explain that at this time, the 1 piece convex was ideal.  He does feel that his abdomen is swollen. I explain that if edema resolves and stoma is more budded, a flat pouch may be appropriate.  I explain the ostomy clinic at [facility] and provide information if outpatient assistance is needed.  [phone]) MD Referral will be needed.  Enrolled patient in [employer] DC program: Yes today.  Will follow.  Maple Hudson MSN, RN, FNP-BC CWON Wound, Ostomy, Continence Nurse Pager [phone]

## 2021-07-21 NOTE — TOC Transition Note (Signed)
Transition of Care [facility]) - CM/SW Discharge Note   Patient Details  Name: Christopher Espinoza MRN: [id number] Date of Birth: [birth year]  Transition of Care [facility]) CM/SW Contact:  Margarito Liner, LCSW Phone Number: 07/21/2021, 1:21 PM   Clinical Narrative:  Patient has orders to discharge home today. Dan Humphreys will be delivered to the home in 3-5 days. [facility] representative said he has received authorization from the [place]. No further concerns. CSW signing off.  Final next level of care: Home w Home Health Services Barriers to Discharge: Barriers Resolved   Patient Goals and CMS Choice Patient states their goals for this hospitalization and ongoing recovery are:: home with home health CMS Medicare.gov Compare Post Acute Care list provided to:: Patient Choice offered to / list presented to : Patient  Discharge Placement                    Patient and family notified of of transfer: 07/21/21  Discharge Plan and Services                DME Arranged: Dan Humphreys rolling DME Agency: [facility], [place] Date DME Agency Contacted: 07/19/21   Representative spoke with at DME Agency: Wonda Cerise HH Arranged: RN, PT [facility] Agency: [facility] Date [facility] Agency Contacted: 07/21/21   Representative spoke with at [facility] Agency: Feliberto Gottron  Social Determinants of Health (SDOH) Interventions     Readmission Risk Interventions No flowsheet data found.

## 2021-07-21 NOTE — Discharge Summary (Signed)
  Patient ID: Christopher Espinoza MRN: [id number] DOB/AGE: 12-Sep-1976 45 y.o.  Admit date: 07/16/2021 Discharge date: 07/21/2021   Discharge Diagnoses:  Active Problems:   Diverticulitis of colon with perforation   Procedures: Exploratory laparotomy with partial colectomy and end colostomy creation  Hospital Course: Patient with perforated diverticulitis.  Christopher Espinoza underwent exploratory laparotomy with partial colectomy and end colostomy creation.  Christopher Espinoza has been recovering adequately.  Christopher Espinoza is tolerating diet.  Colostomy working adequately.  Wound is healing well.  Patient ambulating.  Physical Exam Cardiovascular:     Rate and Rhythm: Normal rate and regular rhythm.  Pulmonary:     Effort: Pulmonary effort is normal.     Breath sounds: Normal breath sounds.  Abdominal:     General: Abdomen is flat. Bowel sounds are normal.     Palpations: Abdomen is soft.  Skin:    General: Skin is warm.  Neurological:     Mental Status: Christopher Espinoza is alert.  Colostomy is pink and patent.   Consults: None  Disposition: Discharge disposition: 01-Home or Self Care       Discharge Instructions     Diet - low sodium heart healthy   Complete by: As directed    Increase activity slowly   Complete by: As directed       Allergies as of 07/21/2021       Reactions   Shellfish Allergy Shortness Of Breath, Swelling   Gabapentin Nausea Only        Medication List     TAKE these medications    HYDROcodone-acetaminophen 5-325 MG tablet Commonly known as: Norco Take 1 tablet by mouth every 4 (four) hours as needed for up to 3 days for moderate pain.   ondansetron 4 MG tablet Commonly known as: Zofran Take 1 tablet (4 mg total) by mouth daily as needed for nausea or vomiting.               Durable Medical Equipment  (From admission, onward)           Start     Ordered   07/19/21 1551  For home use only DME Walker rolling  Once       Question Answer Comment  Walker: With 5 Inch Wheels    Patient needs a walker to treat with the following condition Diverticulitis of colon with perforation      07/19/21 1550            Follow-up Information     [facility], [facility] Follow up.   Specialty: Home Health Services Why: They will follow up with you for your home health needs: Physical therapy and nursing.        Carolan Shiver, MD Follow up in 2 week(s).   Specialty: General Surgery Why: after partial colectomy and colostomy Contact information: [address] [place] [address] [phone]

## 2021-07-21 NOTE — Plan of Care (Signed)
  Problem: Clinical Measurements: Goal: Ability to maintain clinical measurements within normal limits will improve Outcome: Progressing Goal: Will remain free from infection Outcome: Progressing Goal: Diagnostic test results will improve Outcome: Progressing Goal: Respiratory complications will improve Outcome: Progressing Goal: Cardiovascular complication will be avoided Outcome: Progressing   Problem: Elimination: Goal: Will not experience complications related to bowel motility Outcome: Progressing   Problem: Pain Managment: Goal: General experience of comfort will improve Outcome: Progressing   Problem: Clinical Measurements: Goal: Ability to maintain clinical measurements within normal limits will improve Outcome: Progressing Goal: Will remain free from infection Outcome: Progressing Goal: Diagnostic test results will improve Outcome: Progressing Goal: Respiratory complications will improve Outcome: Progressing Goal: Cardiovascular complication will be avoided Outcome: Progressing   Problem: Elimination: Goal: Will not experience complications related to bowel motility Outcome: Progressing   Problem: Pain Managment: Goal: General experience of comfort will improve Outcome: Progressing   Pt is involved in and agrees with the plan of care. V/S stable. No complaints of pain. Nausea noted; zofran given. Had several large amount of output from colostomy.

## 2021-07-21 NOTE — Discharge Instructions (Signed)

## 2021-07-21 NOTE — Plan of Care (Signed)

## 2021-07-21 NOTE — Progress Notes (Signed)
AVS given and reviewed with pt. Medications discussed. Per social work, walker to be delivered to [employer]; pt aware. All questions answered to satisfaction. Pt verbalized understanding of information given, including ostomy care. Ostomy supplies provided to pt. Pt to be escorted off the unit with all belongings via wheelchair by staff member.

## 2021-07-21 NOTE — TOC Progression Note (Addendum)
Transition of Care [facility]) - Progression Note    Patient Details  Name: Christopher Espinoza MRN: [id number] Date of Birth: 09/05/1976  Transition of Care [facility]) CM/SW Contact  Margarito Liner, LCSW Phone Number: 07/21/2021, 9:13 AM  Clinical Narrative:   Tried calling patient to provide update but no answer. Will try again later.  9:46 am: [facility] representative has received LOG information to cover home health orders until patient established with another provider at the [facility]. Updated patient.   10:09 am: Faxed discharge summary to Dr. Lovell Sheehan office at [facility] so they can schedule appointment with alternate provider as soon as possible.  10:27 am: Spoke with Diplomatic Services operational officer at Dr. Lovell Sheehan office. She is sending message to person that can help get him set up with alternate provider and asking them to me call back.  Expected Discharge Plan: Home w Home Health Services Barriers to Discharge: Continued Medical Work up, English as a second language teacher  Expected Discharge Plan and Services Expected Discharge Plan: Home w Home Health Services       Living arrangements for the past 2 months: Single Family Home                 DME Arranged: Walker rolling DME Agency: [facility], [place] Date DME Agency Contacted: 07/18/21   Representative spoke with at DME Agency: Wonda Cerise - [facility] approval pending HH Arranged: PT, RN HH Agency: [facility] Date HH Agency Contacted: 07/18/21   Representative spoke with at [facility] Agency: Feliberto Gottron - [facility] approval pending   Social Determinants of Health (SDOH) Interventions    Readmission Risk Interventions No flowsheet data found.

## 2021-12-29 ENCOUNTER — Encounter: Payer: Self-pay | Admitting: Surgery

## 2021-12-30 ENCOUNTER — Encounter: Payer: Self-pay | Admitting: Surgery

## 2021-12-30 ENCOUNTER — Ambulatory Visit
Admission: RE | Admit: 2021-12-30 | Discharge: 2021-12-30 | Disposition: A | Discharge status: Home / Self Care | Payer: No Typology Code available for payment source | Attending: Surgery | Admitting: Surgery

## 2021-12-30 ENCOUNTER — Ambulatory Visit: Payer: No Typology Code available for payment source | Admitting: Anesthesiology

## 2021-12-30 ENCOUNTER — Encounter
Admission: RE | Disposition: A | Discharge status: Home / Self Care | Payer: Self-pay | Source: Home / Self Care | Attending: Surgery

## 2021-12-30 DIAGNOSIS — K5732 Diverticulitis of large intestine without perforation or abscess without bleeding: Secondary | ICD-10-CM | POA: Insufficient documentation

## 2021-12-30 DIAGNOSIS — F17210 Nicotine dependence, cigarettes, uncomplicated: Secondary | ICD-10-CM | POA: Diagnosis not present

## 2021-12-30 DIAGNOSIS — Z933 Colostomy status: Secondary | ICD-10-CM | POA: Insufficient documentation

## 2021-12-30 DIAGNOSIS — Z09 Encounter for follow-up examination after completed treatment for conditions other than malignant neoplasm: Secondary | ICD-10-CM | POA: Diagnosis present

## 2021-12-30 HISTORY — DX: Post-traumatic stress disorder, unspecified: F43.10

## 2021-12-30 HISTORY — PX: COLONOSCOPY WITH PROPOFOL: SHX5780

## 2021-12-30 SURGERY — COLONOSCOPY WITH PROPOFOL
Anesthesia: General

## 2021-12-30 MED ORDER — PROPOFOL 500 MG/50ML IV EMUL
INTRAVENOUS | Status: DC | PRN
Start: 2021-12-30 — End: 2021-12-30
  Administered 2021-12-30: 175 ug/kg/min via INTRAVENOUS

## 2021-12-30 MED ORDER — PHENYLEPHRINE HCL (PRESSORS) 10 MG/ML IV SOLN
INTRAVENOUS | Status: DC | PRN
  Administered 2021-12-30: 80 ug via INTRAVENOUS

## 2021-12-30 MED ORDER — PROPOFOL 500 MG/50ML IV EMUL
INTRAVENOUS | Status: AC
  Filled 2021-12-30: qty 50

## 2021-12-30 MED ORDER — PROPOFOL 10 MG/ML IV BOLUS
INTRAVENOUS | Status: DC | PRN
  Administered 2021-12-30: 80 mg via INTRAVENOUS

## 2021-12-30 MED ORDER — SODIUM CHLORIDE 0.9 % IV SOLN: INTRAVENOUS | Status: DC

## 2021-12-30 NOTE — Interval H&P Note (Signed)
No change. OK to proceed.

## 2021-12-30 NOTE — Transfer of Care (Signed)
Immediate Anesthesia Transfer of Care Note ? ?Patient: Christopher Espinoza ? ?Procedure(s) Performed: COLONOSCOPY WITH PROPOFOL ? ?Patient Location: PACU and Endoscopy Unit ? ?Anesthesia Type:General ? ?Level of Consciousness: drowsy ? ?Airway & Oxygen Therapy: Patient Spontanous Breathing ? ?Post-op Assessment: Report given to RN ? ?Post vital signs: stable ? ?Last Vitals:  ?Vitals Value Taken Time  ?BP 92/67 12/30/21 1502  ?Temp 36.7 ?C 12/30/21 1500  ?Pulse 81 12/30/21 1502  ?Resp 20 12/30/21 1502  ?SpO2 91 % 12/30/21 1502  ?Vitals shown include unvalidated device data. ? ?Last Pain:  ?Vitals:  ? 12/30/21 1500  ?TempSrc: Temporal  ?PainSc: Asleep  ?   ? ?  ? ?Complications: No notable events documented. ?

## 2021-12-30 NOTE — Anesthesia Preprocedure Evaluation (Signed)
Anesthesia Evaluation  ?Patient identified by MRN, date of birth, ID band ?Patient awake ? ? ? ?Reviewed: ?Allergy & Precautions, NPO status , Patient's Chart, lab work & pertinent test results ? ?History of Anesthesia Complications ?Negative for: history of anesthetic complications ? ?Airway ?Mallampati: III ? ?TM Distance: >3 FB ?Neck ROM: Full ? ? ? Dental ? ?(+) Poor Dentition, Loose, Dental Advidsory Given,  ?  ?Pulmonary ?neg shortness of breath, neg sleep apnea, neg COPD, neg recent URI, Current Smoker and Patient abstained from smoking.,  ?  ?breath sounds clear to auscultation- rhonchi ?(-) wheezing ? ? ? ? ? Cardiovascular ?Exercise Tolerance: Good ?(-) hypertension(-) angina(-) CAD, (-) Past MI, (-) Cardiac Stents and (-) CABG  ?Rhythm:Regular Rate:Normal ?- Systolic murmurs and - Diastolic murmurs ? ?  ?Neuro/Psych ?neg Seizures negative neurological ROS ? negative psych ROS  ? GI/Hepatic ?negative GI ROS, Neg liver ROS,   ?Endo/Other  ?negative endocrine ROSneg diabetes ? Renal/GU ?negative Renal ROS  ? ?  ?Musculoskeletal ?negative musculoskeletal ROS ?(+)  ? Abdominal ?(+) - obese,   ?Peds ? Hematology ?negative hematology ROS ?(+)   ?Anesthesia Other Findings ?Past Medical History: ?right foot: Neuropathy ?    Comment:  due to trigger toe ? ? Reproductive/Obstetrics ? ?  ? ? ? ? ? ? ? ? ? ? ? ? ? ?  ?  ? ? ? ? ? ? ? ? ?Anesthesia Physical ? ?Anesthesia Plan ? ?ASA: 2 ? ?Anesthesia Plan: General  ? ?Post-op Pain Management:   ? ?Induction: Intravenous ? ?PONV Risk Score and Plan: 1 and Propofol infusion and TIVA ? ?Airway Management Planned: Natural Airway and Nasal Cannula ? ?Additional Equipment:  ? ?Intra-op Plan:  ? ?Post-operative Plan:  ? ?Informed Consent: I have reviewed the patients History and Physical, chart, labs and discussed the procedure including the risks, benefits and alternatives for the proposed anesthesia with the patient or authorized  representative who has indicated his/her understanding and acceptance.  ? ? ? ?Dental advisory given ? ?Plan Discussed with: CRNA and Anesthesiologist ? ?Anesthesia Plan Comments:   ? ? ? ? ? ? ?Anesthesia Quick Evaluation ? ?

## 2021-12-30 NOTE — H&P (Signed)
HISTORY OF PRESENT ILLNESS:  ? ?Christopher Espinoza is a 46 y.o.male patient who comes for follow up  ? ?Patient today endorses feeling much better. He endorses that the itching has resolved. He denies any pain. He is still getting used to having the colostomy. He is slowly getting more comfortable with colostomy care. Denies any nausea or vomiting. Tolerating diet well. ? ? ?PAST MEDICAL HISTORY:  ?Past Medical History:  ?Diagnosis Date  ? Neuropathy  ?Right leg just above the knee and down  ? PTSD (post-traumatic stress disorder)  ? ? ? ?PAST SURGICAL HISTORY:  ?Past Surgical History:  ?Procedure Laterality Date  ? EXPLORATORY LAPAROTOMY 07/16/2021  ?Dr Arrie Senate  ? foot surgery Right 2005  ?repair tendons in foot  ? ? ?MEDICATIONS:  ?Outpatient Encounter Medications as of 08/19/2021  ?Medication Sig Dispense Refill  ? cetirizine (ZYRTEC) 10 mg capsule Take 1 capsule (10 mg total) by mouth once daily 30 capsule 11  ? ?No facility-administered encounter medications on file as of 08/19/2021.  ? ? ?ALLERGIES:  ?Shellfish containing products and Gabapentin ? ?SOCIAL HISTORY:  ?Social History  ? ?Socioeconomic History  ? Marital status: Single  ?Tobacco Use  ? Smoking status: Every Day  ?Types: Cigarettes  ? Smokeless tobacco: Never  ?Vaping Use  ? Vaping Use: Never used  ?Substance and Sexual Activity  ? Alcohol use: Never  ? Drug use: Never  ? ?FAMILY HISTORY:  ?Family History  ?Problem Relation Age of Onset  ? Heart disease Mother  ? Cancer Mother  ? Heart disease Father  ? Mental illness Maternal Grandmother  ? ? ?PHYSICAL EXAM:  ?Vitals:  ?08/19/21 0818  ?BP: 126/80  ?Pulse: 98  ?.  ?Ht:180.3 cm (5\' 11" ) Wt:88.5 kg (195 lb) surface area is 2.11 meters squared. ?Body mass index is 27.2 kg/m?WCB:JSEG. ? ?GENERAL: Alert, active, oriented x3 ? ?ABDOMEN: Soft and depressible, nontender with no palpable mass, no hepatomegaly. Wounds dry and clean. Colostomy pink and patent ? ?EXTREMITIES: Well-developed well-nourished  symmetrical with no dependent edema. ? ?NEUROLOGICAL: Awake alert oriented, facial expression symmetrical, moving all extremities. ? ? ?IMPRESSION:  ? 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 Then he will return to work without any restrictions.  ? ?PLAN:  ?1. We will coordinate for diagnostic colonoscopy at the beginning of April ?2. Will follow you after colonoscopy for discussion of colostomy reversal ?3. We recommend high-fiber diet and drinking plenty of water to keep stool soft ?4. Contact May if you have any concern ? ?Patient verbalized understanding, all questions were answered, and were agreeable with the plan outlined above.  ?

## 2021-12-30 NOTE — Op Note (Signed)
[facility] ?Gastroenterology ?Patient Name: Christopher Espinoza ?Procedure Date: 12/30/2021 2:36 PM ?MRN: [id number] ?Account #: [id number] ?Date of Birth: [birth year] ?Admit Type: Outpatient ?Age: 46 ?Room: [facility] ENDO ROOM 1 ?Gender: Male ?Note Status: Finalized ?Instrument Name: Colonoscope [id number] ?Procedure:             Colonoscopy ?Indications:           Follow-up of diverticulitis, colostomy status ?Providers:             Arville Go MD, MD ?Referring MD:          [facility] (Referring MD) ?Medicines:             Propofol per Anesthesia ?Complications:         No immediate complications. ?Procedure:             Pre-Anesthesia Assessment: ?                       - After reviewing the risks and benefits, the patient  ?                       was deemed in satisfactory condition to undergo the  ?                       procedure in an ambulatory setting. ?                       After obtaining informed consent, the colonoscope was  ?                       passed under direct vision. Throughout the procedure,  ?                       the patient's blood pressure, pulse, and oxygen  ?                       saturations were monitored continuously. The  ?                       Colonoscope was introduced through the sigmoid  ?                       colostomy and advanced to the the cecum, identified by  ?                       the ileocecal valve. After going through ostomy, pt  ?                       repositioned and scope introduced through anus to end  ?                       of hartman's pouch. The colonoscopy was performed  ?                       without difficulty. The patient tolerated the  ?                       procedure well. The quality of the bowel preparation  ?  was fair. ?Findings: ?     The perianal and digital rectal examinations were normal. ?     The exam was otherwise normal throughout the examined colon. ?Impression:            - Preparation of the colon  was fair. ?                       - No specimens collected. ?Recommendation:        - Discharge patient to home. ?                       - Resume previous diet. ?                       - Written discharge instructions were provided to the  ?                       patient. ?                       - Repeat colonoscopy PRN for screening purposes. ?Procedure Code(s):     --- Professional --- ?                       [phone], Colonoscopy through stoma; diagnostic,  ?                       including collection of specimen(s) by brushing or  ?                       washing, when performed (separate procedure) ?                       [id number], Sigmoidoscopy, flexible; diagnostic, including  ?                       collection of specimen(s) by brushing or washing, when  ?                       performed (separate procedure) ?Diagnosis Code(s):     --- Professional --- ?                       K57.32, Diverticulitis of large intestine without  ?                       perforation or abscess without bleeding ?CPT copyright 2019 American Medical Association. All rights reserved. ?The codes documented in this report are preliminary and upon coder review may  ?be revised to meet current compliance requirements. ?Dr. Harrie Foreman, MD ?Arville Go MD, MD ?12/30/2021 3:05:01 PM ?This report has been signed electronically. ?Number of Addenda: 0 ?Note Initiated On: 12/30/2021 2:36 PM ?Scope Withdrawal Time: 0 hours 9 minutes 38 seconds  ?Total Procedure Duration: 0 hours 14 minutes 26 seconds  ?Estimated Blood Loss:  Estimated blood loss: none. ?     [facility] ?

## 2021-12-31 ENCOUNTER — Encounter: Payer: Self-pay | Admitting: Surgery

## 2021-12-31 NOTE — Anesthesia Postprocedure Evaluation (Signed)
Anesthesia Post Note ? ?Patient: Christopher Espinoza ? ?Procedure(s) Performed: COLONOSCOPY WITH PROPOFOL ? ?Patient location during evaluation: Endoscopy ?Anesthesia Type: General ?Level of consciousness: awake and alert ?Pain management: pain level controlled ?Vital Signs Assessment: post-procedure vital signs reviewed and stable ?Respiratory status: spontaneous breathing, nonlabored ventilation and respiratory function stable ?Cardiovascular status: blood pressure returned to baseline and stable ?Postop Assessment: no apparent nausea or vomiting ?Anesthetic complications: no ? ? ?No notable events documented. ? ? ?Last Vitals:  ?Vitals:  ? 12/30/21 1510 12/30/21 1514  ?BP: 107/90 107/90  ?Pulse:  84  ?Resp:    ?Temp:    ?SpO2:  96%  ?  ?Last Pain:  ?Vitals:  ? 12/31/21 0748  ?TempSrc:   ?PainSc: 0-No pain  ? ? ?  ?  ?  ?  ?  ?  ? ?Foye Deer ? ? ? ? ?

## 2022-01-05 ENCOUNTER — Ambulatory Visit: Payer: Self-pay | Admitting: General Surgery

## 2022-01-05 NOTE — H&P (Signed)
HISTORY OF PRESENT ILLNESS:  ?  ?Christopher Espinoza is a 46 y.o.male patient who comes for surgical evaluation for colostomy reversal. ? ?Patient with history of perforated diverticulitis on November 2022.  He had Hartman's procedure.  He has been recovering adequately.  The patient endorses that the colostomy is working adequately.  He denies any pain.  He is having regular bowel movements.  He had a recent colonoscopy that shows no other pathologies.  Patient denies any abdominal pain.  There is no abdominal distention.  Patient denies any nausea or vomiting.  No pain radiation.  No alleviating or aggravating factors. ?  ?PAST MEDICAL HISTORY:  ?Past Medical History:  ?Diagnosis Date  ? Neuropathy   ? Right leg just above the knee and down  ? PTSD (post-traumatic stress disorder)   ?  ?  ?  ?PAST SURGICAL HISTORY:   ?Past Surgical History:  ?Procedure Laterality Date  ? foot surgery Right 2005  ? repair tendons in foot  ? EXPLORATORY LAPAROTOMY  07/16/2021  ? Dr Lesli Albee  ?    ?   ?MEDICATIONS:  ?Outpatient Encounter Medications as of 01/04/2022  ?Medication Sig Dispense Refill  ? cetirizine (ZYRTEC) 10 mg capsule Take 1 capsule (10 mg total) by mouth once daily 30 capsule 11  ? ciprofloxacin HCl (CIPRO) 500 MG tablet Take 1 tablet (500 mg total) by mouth 2 (two) times daily for 1 day Take it in the morning and evening the day before the surgery 2 tablet 0  ? metroNIDAZOLE (FLAGYL) 500 MG tablet Take 2 tablets at 2 pm, 3 pm and 10 pm the day before surgery. 6 tablet 0  ? ?No facility-administered encounter medications on file as of 01/04/2022.  ? ?  ?ALLERGIES:   ?Shellfish containing products and Gabapentin ?  ?SOCIAL HISTORY:  ?Social History  ? ?Socioeconomic History  ? Marital status: Single  ?Tobacco Use  ? Smoking status: Every Day  ?  Types: Cigarettes  ? Smokeless tobacco: Never  ?Vaping Use  ? Vaping Use: Never used  ?Substance and Sexual Activity  ? Alcohol use: Never  ? Drug use: Never  ? ? ?FAMILY HISTORY:   ?Family History  ?Problem Relation Age of Onset  ? Heart disease Mother   ? Cancer Mother   ? Heart disease Father   ? Mental illness Maternal Grandmother   ?  ? ?GENERAL REVIEW OF SYSTEMS:  ? ?General ROS: negative for - chills, fatigue, fever, weight gain or weight loss ?Allergy and Immunology ROS: negative for - hives  ?Hematological and Lymphatic ROS: negative for - bleeding problems or bruising, negative for palpable nodes ?Endocrine ROS: negative for - heat or cold intolerance, hair changes ?Respiratory ROS: negative for - cough, shortness of breath or wheezing ?Cardiovascular ROS: no chest pain or palpitations ?GI ROS: negative for nausea, vomiting, abdominal pain, diarrhea, constipation ?Musculoskeletal ROS: negative for - joint swelling or muscle pain ?Neurological ROS: negative for - confusion, syncope ?Dermatological ROS: negative for pruritus and rash ? ?PHYSICAL EXAM:  ?Vitals:  ? 01/04/22 1018  ?BP: (!) 154/93  ?Pulse: 107  ?Marland Kitchen  ?Ht:180.3 cm (5\' 11" ) Wt:88.5 kg (195 lb) [id number] surface area is 2.11 meters squared. ?Body mass index is 27.2 kg/m?Marland Kitchen. ?  ?GENERAL: Alert, active, oriented x3 ? ?HEENT: Pupils equal reactive to light. Extraocular movements are intact. Sclera clear. Palpebral conjunctiva normal red color.Pharynx clear. ? ?NECK: Supple with no palpable mass and no adenopathy. ? ?LUNGS: Sound clear with no rales rhonchi or  wheezes. ? ?HEART: Regular rhythm S1 and S2 without murmur. ? ?ABDOMEN: Soft and depressible, nontender with no palpable mass, no hepatomegaly.  Midline scar well-healed.  Pink and patent and colostomy. ? ?EXTREMITIES: Well-developed well-nourished symmetrical with no dependent edema. ? ?NEUROLOGICAL: Awake alert oriented, facial expression symmetrical, moving all extremities. ?  ?   ?IMPRESSION:  ?  ? 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 ?Patient with end colostomy due to perforated diverticulitis.  There has been no issues during the last 6 months.  He had a colonoscopy that  shows no other colonic pathology. ? ?Today we discussed about colostomy reversal.  We discussed about robotic-assisted laparoscopic procedure.  We discussed about the risk of surgery to include bleeding, infection, injury to large or small bowel intestine, intra-abdominal abscess, injury to ureters and bladder, intestinal fistulous, anastomosis leak, intestinal obstruction, and even death.  The patient reports he understood this risk and agreed to proceed.        ?  ?PLAN:  ?1.  Robotic assisted laparoscopic laparoscopic colostomy reversal ?2.  CBC, CMP ?3.  Avoid taking aspirin 5 days before the surgery ?4.  Take antibiotic therapy as prescribed the day before the surgery ?5.  Take the bowel prep as directed the day before the surgery ?6.  Contact us if you have any concern ? ?Patient verbalized understanding, all questions were answered, and were agreeable with the plan outlined above.  ? ?Christopher Pun, MD ? ?Electronically signed by Christopher Pun, MD ? ?

## 2022-01-05 NOTE — H&P (View-Only) (Signed)
HISTORY OF PRESENT ILLNESS:  ?  ?Mr. Espinoza is a 46 y.o.male patient who comes for surgical evaluation for colostomy reversal. ? ?Patient with history of perforated diverticulitis on November 2022.  He had Hartman's procedure.  He has been recovering adequately.  The patient endorses that the colostomy is working adequately.  He denies any pain.  He is having regular bowel movements.  He had a recent colonoscopy that shows no other pathologies.  Patient denies any abdominal pain.  There is no abdominal distention.  Patient denies any nausea or vomiting.  No pain radiation.  No alleviating or aggravating factors. ?  ?PAST MEDICAL HISTORY:  ?Past Medical History:  ?Diagnosis Date  ? Neuropathy   ? Right leg just above the knee and down  ? PTSD (post-traumatic stress disorder)   ?  ?  ?  ?PAST SURGICAL HISTORY:   ?Past Surgical History:  ?Procedure Laterality Date  ? foot surgery Right 2005  ? repair tendons in foot  ? EXPLORATORY LAPAROTOMY  07/16/2021  ? Dr Stevana Dufner Cintron  ?    ?   ?MEDICATIONS:  ?Outpatient Encounter Medications as of 01/04/2022  ?Medication Sig Dispense Refill  ? cetirizine (ZYRTEC) 10 mg capsule Take 1 capsule (10 mg total) by mouth once daily 30 capsule 11  ? ciprofloxacin HCl (CIPRO) 500 MG tablet Take 1 tablet (500 mg total) by mouth 2 (two) times daily for 1 day Take it in the morning and evening the day before the surgery 2 tablet 0  ? metroNIDAZOLE (FLAGYL) 500 MG tablet Take 2 tablets at 2 pm, 3 pm and 10 pm the day before surgery. 6 tablet 0  ? ?No facility-administered encounter medications on file as of 01/04/2022.  ? ?  ?ALLERGIES:   ?Shellfish containing products and Gabapentin ?  ?SOCIAL HISTORY:  ?Social History  ? ?Socioeconomic History  ? Marital status: Single  ?Tobacco Use  ? Smoking status: Every Day  ?  Types: Cigarettes  ? Smokeless tobacco: Never  ?Vaping Use  ? Vaping Use: Never used  ?Substance and Sexual Activity  ? Alcohol use: Never  ? Drug use: Never  ? ? ?FAMILY HISTORY:   ?Family History  ?Problem Relation Age of Onset  ? Heart disease Mother   ? Cancer Mother   ? Heart disease Father   ? Mental illness Maternal Grandmother   ?  ? ?GENERAL REVIEW OF SYSTEMS:  ? ?General ROS: negative for - chills, fatigue, fever, weight gain or weight loss ?Allergy and Immunology ROS: negative for - hives  ?Hematological and Lymphatic ROS: negative for - bleeding problems or bruising, negative for palpable nodes ?Endocrine ROS: negative for - heat or cold intolerance, hair changes ?Respiratory ROS: negative for - cough, shortness of breath or wheezing ?Cardiovascular ROS: no chest pain or palpitations ?GI ROS: negative for nausea, vomiting, abdominal pain, diarrhea, constipation ?Musculoskeletal ROS: negative for - joint swelling or muscle pain ?Neurological ROS: negative for - confusion, syncope ?Dermatological ROS: negative for pruritus and rash ? ?PHYSICAL EXAM:  ?Vitals:  ? 01/04/22 1018  ?BP: (!) 154/93  ?Pulse: 107  ?.  ?Ht:180.3 cm (5' 11") Wt:88.5 kg (195 lb) BSA:Body surface area is 2.11 meters squared. ?Body mass index is 27.2 kg/m?.. ?  ?GENERAL: Alert, active, oriented x3 ? ?HEENT: Pupils equal reactive to light. Extraocular movements are intact. Sclera clear. Palpebral conjunctiva normal red color.Pharynx clear. ? ?NECK: Supple with no palpable mass and no adenopathy. ? ?LUNGS: Sound clear with no rales rhonchi or   wheezes. ? ?HEART: Regular rhythm S1 and S2 without murmur. ? ?ABDOMEN: Soft and depressible, nontender with no palpable mass, no hepatomegaly.  Midline scar well-healed.  Pink and patent and colostomy. ? ?EXTREMITIES: Well-developed well-nourished symmetrical with no dependent edema. ? ?NEUROLOGICAL: Awake alert oriented, facial expression symmetrical, moving all extremities. ?  ?   ?IMPRESSION:  ?  ? 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 ?Patient with end colostomy due to perforated diverticulitis.  There has been no issues during the last 6 months.  He had a colonoscopy that  shows no other colonic pathology. ? ?Today we discussed about colostomy reversal.  We discussed about robotic-assisted laparoscopic procedure.  We discussed about the risk of surgery to include bleeding, infection, injury to large or small bowel intestine, intra-abdominal abscess, injury to ureters and bladder, intestinal fistulous, anastomosis leak, intestinal obstruction, and even death.  The patient reports he understood this risk and agreed to proceed.        ?  ?PLAN:  ?1.  Robotic assisted laparoscopic laparoscopic colostomy reversal ?2.  CBC, CMP ?3.  Avoid taking aspirin 5 days before the surgery ?4.  Take antibiotic therapy as prescribed the day before the surgery ?5.  Take the bowel prep as directed the day before the surgery ?6.  Contact us if you have any concern ? ?Patient verbalized understanding, all questions were answered, and were agreeable with the plan outlined above.  ? ?Josslyn Ciolek Cintron-Diaz, MD ? ?Electronically signed by Yissel Habermehl Cintron-Diaz, MD ? ?

## 2022-01-07 ENCOUNTER — Other Ambulatory Visit
Admission: RE | Admit: 2022-01-07 | Discharge: 2022-01-07 | Disposition: A | Discharge status: Home / Self Care | Payer: No Typology Code available for payment source | Source: Ambulatory Visit | Attending: General Surgery | Admitting: General Surgery

## 2022-01-07 ENCOUNTER — Other Ambulatory Visit: Payer: Self-pay

## 2022-01-07 HISTORY — DX: Diverticulitis of intestine, part unspecified, with perforation and abscess with bleeding: K57.81

## 2022-01-07 HISTORY — DX: Personal history of urinary calculi: Z87.442

## 2022-01-07 NOTE — Patient Instructions (Addendum)
Your procedure is scheduled on: 01/17/22 - Monday ?Report to the Registration Desk on the 1st floor of the [facility]. ?To find out your arrival time, please call [phone] between 1PM - 3PM on: 05/05/2 Friday ?If your arrival time is 6:00 am, do not arrive prior to that time as the [facility] entrance doors do not open until 6:00 am. ? ?REMEMBER: ?Instructions that are not followed completely may result in serious medical risk, up to and including death; or upon the discretion of your surgeon and anesthesiologist your surgery may need to be rescheduled. ? ?Follow the diet instructions given to you by Dr. Hazle Quant. ? ?-  Take antibiotic therapy as prescribed the day before the surgery given to you by Dr. Harl Bowie Trisha Mangle ? ?-  Take the bowel prep as directed the day before the surgery given to you by Dr. Maia Plan Trisha Mangle ? ?One week prior to surgery: ?Stop Anti-inflammatories (NSAIDS) such as Advil, Aleve, Ibuprofen, Motrin, Naproxen, Naprosyn and Aspirin based products such as Excedrin, Goodys Powder, BC Powder. ? ?Stop ANY OVER THE COUNTER supplements until after surgery. ? ?You may however, continue to take Tylenol if needed for pain up until the day of surgery. ? ?No Alcohol for 24 hours before or after surgery. ? ?No Smoking including e-cigarettes for 24 hours prior to surgery.  ?No chewable tobacco products for at least 6 hours prior to surgery.  ?No nicotine patches on the day of surgery. ? ?Do not use any "recreational" drugs for at least a week prior to your surgery.  ?Please be advised that the combination of cocaine and anesthesia may have negative outcomes, up to and including death. ?If you test positive for cocaine, your surgery will be cancelled. ? ?On the morning of surgery brush your teeth with toothpaste and water, you may rinse your mouth with mouthwash if you wish. ?Do not swallow any toothpaste or mouthwash. ? ?Use CHG Soap or wipes as directed on instruction sheet. ? ?Do not wear  jewelry, make-up, hairpins, clips or nail polish. ? ?Do not wear lotions, powders, or perfumes.  ? ?Do not shave body from the neck down 48 hours prior to surgery just in case you cut yourself which could leave a site for infection.  ?Also, freshly shaved skin may become irritated if using the CHG soap. ? ?Contact lenses, hearing aids and dentures may not be worn into surgery. ? ?Do not bring valuables to the hospital. [facility] is not responsible for any missing/lost belongings or valuables.  ? ?Notify your doctor if there is any change in your medical condition (cold, fever, infection). ? ?Wear comfortable clothing (specific to your surgery type) to the hospital. ? ?After surgery, you can help prevent lung complications by doing breathing exercises.  ?Take deep breaths and cough every 1-2 hours. Your doctor may order a device called an Incentive Spirometer to help you take deep breaths. ?When coughing or sneezing, hold a pillow firmly against your incision with both hands. This is called ?splinting.? Doing this helps protect your incision. It also decreases belly discomfort. ? ?If you are being admitted to the hospital overnight, leave your suitcase in the car. ?After surgery it may be brought to your room. ? ?If you are being discharged the day of surgery, you will not be allowed to drive home. ?You will need a responsible adult (18 years or older) to drive you home and stay with you that night.  ? ?If you are taking public  transportation, you will need to have a responsible adult (18 years or older) with you. ?Please confirm with your physician that it is acceptable to use public transportation.  ? ?Please call the Pre-admissions Testing Dept. at [phone] if you have any questions about these instructions. ? ?Surgery Visitation Policy: ? ?Patients undergoing a surgery or procedure may have two family members or support persons with them as long as the person is not COVID-19 positive or experiencing its  symptoms.  ? ?Inpatient Visitation:   ? ?Visiting hours are 7 a.m. to 8 p.m. ?Up to four visitors are allowed at one time in a patient room, including children. The visitors may rotate out with other people during the day. One designated support person (adult) may remain overnight.  ?

## 2022-01-17 ENCOUNTER — Other Ambulatory Visit: Payer: Self-pay

## 2022-01-17 ENCOUNTER — Inpatient Hospital Stay
Admission: RE | Admit: 2022-01-17 | Discharge: 2022-01-19 | DRG: 346 | Disposition: A | Discharge status: Home / Self Care | Payer: No Typology Code available for payment source | Attending: General Surgery | Admitting: General Surgery

## 2022-01-17 ENCOUNTER — Encounter: Payer: Self-pay | Admitting: General Surgery

## 2022-01-17 ENCOUNTER — Encounter
Admission: RE | Disposition: A | Discharge status: Home / Self Care | Payer: Self-pay | Source: Home / Self Care | Attending: General Surgery

## 2022-01-17 ENCOUNTER — Inpatient Hospital Stay: Payer: No Typology Code available for payment source | Admitting: Certified Registered"

## 2022-01-17 DIAGNOSIS — F17210 Nicotine dependence, cigarettes, uncomplicated: Secondary | ICD-10-CM | POA: Diagnosis present

## 2022-01-17 DIAGNOSIS — Z433 Encounter for attention to colostomy: Principal | ICD-10-CM

## 2022-01-17 DIAGNOSIS — Z20822 Contact with and (suspected) exposure to covid-19: Secondary | ICD-10-CM | POA: Diagnosis present

## 2022-01-17 DIAGNOSIS — Z8249 Family history of ischemic heart disease and other diseases of the circulatory system: Secondary | ICD-10-CM

## 2022-01-17 DIAGNOSIS — Z8719 Personal history of other diseases of the digestive system: Secondary | ICD-10-CM | POA: Diagnosis not present

## 2022-01-17 DIAGNOSIS — Z933 Colostomy status: Principal | ICD-10-CM

## 2022-01-17 HISTORY — PX: XI ROBOTIC ASSISTED COLOSTOMY TAKEDOWN: SHX6828

## 2022-01-17 LAB — POCT I-STAT, CHEM 8
BUN: 9 mg/dL (ref 6–20)
Calcium, Ion: 1.14 mmol/L — ABNORMAL LOW (ref 1.15–1.40)
Chloride: 106 mmol/L (ref 98–111)
Creatinine, Ser: 1.3 mg/dL — ABNORMAL HIGH (ref 0.61–1.24)
Glucose, Bld: 79 mg/dL (ref 70–99)
HCT: 41 % (ref 39.0–52.0)
Hemoglobin: 13.9 g/dL (ref 13.0–17.0)
Potassium: 4.1 mmol/L (ref 3.5–5.1)
Sodium: 139 mmol/L (ref 135–145)
TCO2: 22 mmol/L (ref 22–32)

## 2022-01-17 SURGERY — CLOSURE, COLOSTOMY, ROBOT-ASSISTED
Anesthesia: General | Site: Abdomen

## 2022-01-17 MED ORDER — FAMOTIDINE 20 MG PO TABS: 20.0000 mg | ORAL_TABLET | Freq: Once | ORAL | Status: AC

## 2022-01-17 MED ORDER — SODIUM CHLORIDE 0.9 % IV SOLN
INTRAVENOUS | Status: AC
  Filled 2022-01-17: qty 2

## 2022-01-17 MED ORDER — ENOXAPARIN SODIUM 40 MG/0.4ML IJ SOSY
40.0000 mg | PREFILLED_SYRINGE | INTRAMUSCULAR | Status: DC
  Administered 2022-01-18 – 2022-01-19 (×2): 40 mg via SUBCUTANEOUS
  Filled 2022-01-17 (×2): qty 0.4

## 2022-01-17 MED ORDER — DEXMEDETOMIDINE HCL IN NACL 200 MCG/50ML IV SOLN
INTRAVENOUS | Status: DC | PRN
  Administered 2022-01-17: 8 ug via INTRAVENOUS
  Administered 2022-01-17: 12 ug via INTRAVENOUS

## 2022-01-17 MED ORDER — HYDROMORPHONE HCL 1 MG/ML IJ SOLN
INTRAMUSCULAR | Status: AC
  Administered 2022-01-17: 0.5 mg via INTRAVENOUS
  Filled 2022-01-17: qty 1

## 2022-01-17 MED ORDER — ACETAMINOPHEN 500 MG PO TABS
ORAL_TABLET | ORAL | Status: AC
  Administered 2022-01-17: 1000 mg via ORAL
  Filled 2022-01-17: qty 2

## 2022-01-17 MED ORDER — SODIUM CHLORIDE 0.9 % IV SOLN
INTRAVENOUS | Status: DC | PRN
  Administered 2022-01-17: 70 mL

## 2022-01-17 MED ORDER — SODIUM CHLORIDE 0.9 % IV SOLN: INTRAVENOUS | Status: DC

## 2022-01-17 MED ORDER — ROCURONIUM BROMIDE 100 MG/10ML IV SOLN
INTRAVENOUS | Status: DC | PRN
  Administered 2022-01-17: 10 mg via INTRAVENOUS
  Administered 2022-01-17: 20 mg via INTRAVENOUS
  Administered 2022-01-17: 30 mg via INTRAVENOUS
  Administered 2022-01-17: 20 mg via INTRAVENOUS
  Administered 2022-01-17: 50 mg via INTRAVENOUS
  Administered 2022-01-17: 10 mg via INTRAVENOUS

## 2022-01-17 MED ORDER — ACETAMINOPHEN 500 MG PO TABS: 1000.0000 mg | ORAL_TABLET | ORAL | Status: AC

## 2022-01-17 MED ORDER — MORPHINE SULFATE (PF) 4 MG/ML IV SOLN
4.0000 mg | INTRAVENOUS | Status: DC | PRN
  Administered 2022-01-18: 4 mg via INTRAVENOUS
  Filled 2022-01-17: qty 1

## 2022-01-17 MED ORDER — LACTATED RINGERS IV SOLN: INTRAVENOUS | Status: DC

## 2022-01-17 MED ORDER — SODIUM CHLORIDE (PF) 0.9 % IJ SOLN
INTRAMUSCULAR | Status: AC
  Filled 2022-01-17: qty 50

## 2022-01-17 MED ORDER — HEPARIN SODIUM (PORCINE) 5000 UNIT/ML IJ SOLN: 5000.0000 [IU] | Freq: Once | INTRAMUSCULAR | Status: AC

## 2022-01-17 MED ORDER — PANTOPRAZOLE SODIUM 40 MG IV SOLR
40.0000 mg | Freq: Every day | INTRAVENOUS | Status: DC
  Administered 2022-01-17 – 2022-01-18 (×2): 40 mg via INTRAVENOUS
  Filled 2022-01-17 (×2): qty 10

## 2022-01-17 MED ORDER — SODIUM CHLORIDE 0.9 % IV SOLN
2.0000 g | INTRAVENOUS | Status: AC
  Administered 2022-01-17: 2 g via INTRAVENOUS

## 2022-01-17 MED ORDER — FENTANYL CITRATE (PF) 100 MCG/2ML IJ SOLN
INTRAMUSCULAR | Status: DC | PRN
  Administered 2022-01-17: 100 ug via INTRAVENOUS
  Administered 2022-01-17: 50 ug via INTRAVENOUS
  Administered 2022-01-17 (×2): 25 ug via INTRAVENOUS

## 2022-01-17 MED ORDER — ONDANSETRON HCL 4 MG/2ML IJ SOLN: 4.0000 mg | Freq: Four times a day (QID) | INTRAMUSCULAR | Status: DC | PRN

## 2022-01-17 MED ORDER — ALVIMOPAN 12 MG PO CAPS: 12.0000 mg | ORAL_CAPSULE | ORAL | Status: AC

## 2022-01-17 MED ORDER — LIDOCAINE HCL (CARDIAC) PF 100 MG/5ML IV SOSY
PREFILLED_SYRINGE | INTRAVENOUS | Status: DC | PRN
Start: 2022-01-17 — End: 2022-01-17
  Administered 2022-01-17: 100 mg via INTRAVENOUS

## 2022-01-17 MED ORDER — SUGAMMADEX SODIUM 200 MG/2ML IV SOLN
INTRAVENOUS | Status: DC | PRN
  Administered 2022-01-17 (×2): 200 mg via INTRAVENOUS

## 2022-01-17 MED ORDER — FAMOTIDINE 20 MG PO TABS
ORAL_TABLET | ORAL | Status: AC
  Administered 2022-01-17: 20 mg via ORAL
  Filled 2022-01-17: qty 1

## 2022-01-17 MED ORDER — HYDROCODONE-ACETAMINOPHEN 5-325 MG PO TABS
1.0000 | ORAL_TABLET | ORAL | Status: DC | PRN
  Administered 2022-01-17 – 2022-01-19 (×7): 2 via ORAL
  Filled 2022-01-17 (×7): qty 2

## 2022-01-17 MED ORDER — GLYCOPYRROLATE 0.2 MG/ML IJ SOLN
INTRAMUSCULAR | Status: DC | PRN
  Administered 2022-01-17: .1 mg via INTRAVENOUS

## 2022-01-17 MED ORDER — ONDANSETRON HCL 4 MG/2ML IJ SOLN
INTRAMUSCULAR | Status: DC | PRN
  Administered 2022-01-17: 4 mg via INTRAVENOUS

## 2022-01-17 MED ORDER — PHENYLEPHRINE HCL (PRESSORS) 10 MG/ML IV SOLN
INTRAVENOUS | Status: DC | PRN
  Administered 2022-01-17: 160 ug via INTRAVENOUS
  Administered 2022-01-17 (×5): 80 ug via INTRAVENOUS
  Administered 2022-01-17: 160 ug via INTRAVENOUS
  Administered 2022-01-17: 80 ug via INTRAVENOUS

## 2022-01-17 MED ORDER — CHLORHEXIDINE GLUCONATE 0.12 % MT SOLN
OROMUCOSAL | Status: AC
  Administered 2022-01-17: 15 mL via OROMUCOSAL
  Filled 2022-01-17: qty 15

## 2022-01-17 MED ORDER — INDOCYANINE GREEN 25 MG IV SOLR
INTRAVENOUS | Status: DC | PRN
  Administered 2022-01-17: 2.5 mg via INTRAVENOUS
  Administered 2022-01-17: 5 mg via INTRAVENOUS

## 2022-01-17 MED ORDER — BUPIVACAINE LIPOSOME 1.3 % IJ SUSP: 20.0000 mL | Freq: Once | INTRAMUSCULAR | Status: DC

## 2022-01-17 MED ORDER — ORAL CARE MOUTH RINSE: 15.0000 mL | Freq: Once | OROMUCOSAL | Status: AC

## 2022-01-17 MED ORDER — CELECOXIB 200 MG PO CAPS: 200.0000 mg | ORAL_CAPSULE | ORAL | Status: AC

## 2022-01-17 MED ORDER — "VISTASEAL 4 ML SINGLE DOSE KIT "
PACK | CUTANEOUS | Status: DC | PRN
  Administered 2022-01-17: 4 mL via TOPICAL

## 2022-01-17 MED ORDER — BUPIVACAINE-EPINEPHRINE (PF) 0.25% -1:200000 IJ SOLN
INTRAMUSCULAR | Status: DC | PRN
Start: 2022-01-17 — End: 2022-01-17
  Administered 2022-01-17: 30 mL

## 2022-01-17 MED ORDER — PROPOFOL 10 MG/ML IV BOLUS
INTRAVENOUS | Status: DC | PRN
  Administered 2022-01-17: 200 mg via INTRAVENOUS

## 2022-01-17 MED ORDER — ALVIMOPAN 12 MG PO CAPS
12.0000 mg | ORAL_CAPSULE | Freq: Two times a day (BID) | ORAL | Status: DC
  Administered 2022-01-18: 12 mg via ORAL
  Filled 2022-01-17: qty 1

## 2022-01-17 MED ORDER — CHLORHEXIDINE GLUCONATE 0.12 % MT SOLN: 15.0000 mL | Freq: Once | OROMUCOSAL | Status: AC

## 2022-01-17 MED ORDER — FENTANYL CITRATE (PF) 100 MCG/2ML IJ SOLN
INTRAMUSCULAR | Status: AC
  Filled 2022-01-17: qty 2

## 2022-01-17 MED ORDER — MIDAZOLAM HCL 2 MG/2ML IJ SOLN
INTRAMUSCULAR | Status: AC
  Filled 2022-01-17: qty 2

## 2022-01-17 MED ORDER — KETAMINE HCL 10 MG/ML IJ SOLN
INTRAMUSCULAR | Status: DC | PRN
  Administered 2022-01-17: 20 mg via INTRAVENOUS
  Administered 2022-01-17: 30 mg via INTRAVENOUS

## 2022-01-17 MED ORDER — HYDROMORPHONE HCL 1 MG/ML IJ SOLN
0.5000 mg | INTRAMUSCULAR | Status: DC | PRN
  Administered 2022-01-17: 0.5 mg via INTRAVENOUS

## 2022-01-17 MED ORDER — EPHEDRINE SULFATE (PRESSORS) 50 MG/ML IJ SOLN
INTRAMUSCULAR | Status: DC | PRN
  Administered 2022-01-17: 10 mg via INTRAVENOUS

## 2022-01-17 MED ORDER — MIDAZOLAM HCL 2 MG/2ML IJ SOLN
INTRAMUSCULAR | Status: DC | PRN
  Administered 2022-01-17: 2 mg via INTRAVENOUS

## 2022-01-17 MED ORDER — FENTANYL CITRATE (PF) 100 MCG/2ML IJ SOLN
INTRAMUSCULAR | Status: AC
  Administered 2022-01-17: 25 ug via INTRAVENOUS
  Filled 2022-01-17: qty 2

## 2022-01-17 MED ORDER — ALVIMOPAN 12 MG PO CAPS
ORAL_CAPSULE | ORAL | Status: AC
  Administered 2022-01-17: 12 mg via ORAL
  Filled 2022-01-17: qty 1

## 2022-01-17 MED ORDER — DEXAMETHASONE SODIUM PHOSPHATE 10 MG/ML IJ SOLN
INTRAMUSCULAR | Status: DC | PRN
  Administered 2022-01-17: 5 mg via INTRAVENOUS

## 2022-01-17 MED ORDER — CELECOXIB 200 MG PO CAPS
200.0000 mg | ORAL_CAPSULE | Freq: Two times a day (BID) | ORAL | Status: DC
  Administered 2022-01-17 – 2022-01-19 (×4): 200 mg via ORAL
  Filled 2022-01-17 (×4): qty 1

## 2022-01-17 MED ORDER — ONDANSETRON HCL 4 MG/2ML IJ SOLN: 4.0000 mg | Freq: Once | INTRAMUSCULAR | Status: DC | PRN

## 2022-01-17 MED ORDER — ONDANSETRON 4 MG PO TBDP: 4.0000 mg | ORAL_TABLET | Freq: Four times a day (QID) | ORAL | Status: DC | PRN

## 2022-01-17 MED ORDER — BUPIVACAINE LIPOSOME 1.3 % IJ SUSP
INTRAMUSCULAR | Status: AC
  Filled 2022-01-17: qty 20

## 2022-01-17 MED ORDER — FENTANYL CITRATE (PF) 100 MCG/2ML IJ SOLN
25.0000 ug | INTRAMUSCULAR | Status: DC | PRN
  Administered 2022-01-17 (×3): 25 ug via INTRAVENOUS

## 2022-01-17 MED ORDER — SEVOFLURANE IN SOLN
RESPIRATORY_TRACT | Status: AC
  Filled 2022-01-17: qty 250

## 2022-01-17 MED ORDER — KETAMINE HCL 50 MG/5ML IJ SOSY
PREFILLED_SYRINGE | INTRAMUSCULAR | Status: AC
  Filled 2022-01-17: qty 5

## 2022-01-17 MED ORDER — HEPARIN SODIUM (PORCINE) 5000 UNIT/ML IJ SOLN
INTRAMUSCULAR | Status: AC
  Administered 2022-01-17: 5000 [IU] via SUBCUTANEOUS
  Filled 2022-01-17: qty 1

## 2022-01-17 MED ORDER — SODIUM CHLORIDE 0.9 % IR SOLN
Status: DC | PRN
  Administered 2022-01-17: 700 mL

## 2022-01-17 MED ORDER — CELECOXIB 200 MG PO CAPS
ORAL_CAPSULE | ORAL | Status: AC
  Administered 2022-01-17: 200 mg via ORAL
  Filled 2022-01-17: qty 1

## 2022-01-17 SURGICAL SUPPLY — 98 items
APPLICATOR VISTASEAL 35 (MISCELLANEOUS) ×1 IMPLANT
BLADE CLIPPER SURG (BLADE) ×1 IMPLANT
BLADE SURG SZ10 CARB STEEL (BLADE) ×2 IMPLANT
BLADE SURG SZ11 CARB STEEL (BLADE) ×2 IMPLANT
CANNULA REDUC XI 12-8 STAPL (CANNULA) ×1
CANNULA REDUCER 12-8 DVNC XI (CANNULA) ×1 IMPLANT
CLIP LIGATING HEMO O LOK GREEN (MISCELLANEOUS) ×1 IMPLANT
COVER TIP SHEARS 8 DVNC (MISCELLANEOUS) ×1 IMPLANT
COVER TIP SHEARS 8MM DA VINCI (MISCELLANEOUS) ×1
DERMABOND ADVANCED (GAUZE/BANDAGES/DRESSINGS) ×1
DERMABOND ADVANCED .7 DNX12 (GAUZE/BANDAGES/DRESSINGS) ×1 IMPLANT
DRAPE ARM DVNC X/XI (DISPOSABLE) ×4 IMPLANT
DRAPE COLUMN DVNC XI (DISPOSABLE) ×1 IMPLANT
DRAPE DA VINCI XI ARM (DISPOSABLE) ×4
DRAPE DA VINCI XI COLUMN (DISPOSABLE) ×1
DRAPE INCISE IOBAN 66X45 STRL (DRAPES) ×2 IMPLANT
DRAPE LEGGINS SURG 28X43 STRL (DRAPES) ×2 IMPLANT
DRAPE UNDER BUTTOCK W/FLU (DRAPES) ×2 IMPLANT
DRSG OPSITE POSTOP 4X10 (GAUZE/BANDAGES/DRESSINGS) IMPLANT
DRSG OPSITE POSTOP 4X8 (GAUZE/BANDAGES/DRESSINGS) IMPLANT
ELECT BLADE 6.5 EXT (BLADE) IMPLANT
ELECT CAUTERY BLADE 6.4 (BLADE) ×2 IMPLANT
ELECT REM PT RETURN 9FT ADLT (ELECTROSURGICAL) ×2
ELECTRODE REM PT RTRN 9FT ADLT (ELECTROSURGICAL) ×1 IMPLANT
GAUZE PACKING IODOFORM 1/2 (PACKING) ×1 IMPLANT
GLOVE BIO SURGEON STRL SZ 6.5 (GLOVE) ×11 IMPLANT
GLOVE BIOGEL PI IND STRL 6.5 (GLOVE) ×3 IMPLANT
GLOVE BIOGEL PI INDICATOR 6.5 (GLOVE) ×8
GOWN STRL REUS W/ TWL LRG LVL3 (GOWN DISPOSABLE) ×6 IMPLANT
GOWN STRL REUS W/TWL LRG LVL3 (GOWN DISPOSABLE) ×8
GRASPER SUT TROCAR 14GX15 (MISCELLANEOUS) ×2 IMPLANT
HANDLE YANKAUER SUCT BULB TIP (MISCELLANEOUS) ×2 IMPLANT
IRRIGATION STRYKERFLOW (MISCELLANEOUS) IMPLANT
IRRIGATOR STRYKERFLOW (MISCELLANEOUS)
IRRIGATOR SUCT 8 DISP DVNC XI (IRRIGATION / IRRIGATOR) IMPLANT
IRRIGATOR SUCTION 8MM XI DISP (IRRIGATION / IRRIGATOR) ×1
IV NS 1000ML (IV SOLUTION) ×1
IV NS 1000ML BAXH (IV SOLUTION) IMPLANT
KIT IMAGING PINPOINTPAQ (MISCELLANEOUS) ×1 IMPLANT
KIT PINK PAD W/HEAD ARE REST (MISCELLANEOUS) ×2
KIT PINK PAD W/HEAD ARM REST (MISCELLANEOUS) ×1 IMPLANT
LABEL OR SOLS (LABEL) ×2 IMPLANT
MANIFOLD NEPTUNE II (INSTRUMENTS) ×2 IMPLANT
NDL INSUFFLATION 14GA 120MM (NEEDLE) ×1 IMPLANT
NEEDLE HYPO 22GX1.5 SAFETY (NEEDLE) ×2 IMPLANT
NEEDLE INSUFFLATION 14GA 120MM (NEEDLE) ×2 IMPLANT
OBTURATOR OPTICAL STANDARD 8MM (TROCAR) ×1
OBTURATOR OPTICAL STND 8 DVNC (TROCAR) ×1
OBTURATOR OPTICALSTD 8 DVNC (TROCAR) ×1 IMPLANT
PACK COLON CLEAN CLOSURE (MISCELLANEOUS) ×2 IMPLANT
PACK LAP CHOLECYSTECTOMY (MISCELLANEOUS) ×2 IMPLANT
PENCIL ELECTRO HAND CTR (MISCELLANEOUS) ×2 IMPLANT
PORT ACCESS TROCAR AIRSEAL 5 (TROCAR) ×2 IMPLANT
RELOAD STAPLE 45 3.5 BLU DVNC (STAPLE) IMPLANT
RELOAD STAPLE 60 3.5 BLU DVNC (STAPLE) IMPLANT
RELOAD STAPLER 3.5X45 BLU DVNC (STAPLE) IMPLANT
RELOAD STAPLER 3.5X60 BLU DVNC (STAPLE) ×1 IMPLANT
RETRACTOR WOUND ALXS 18CM SML (MISCELLANEOUS) IMPLANT
RTRCTR WOUND ALEXIS O 18CM SML (MISCELLANEOUS)
SEAL CANN UNIV 5-8 DVNC XI (MISCELLANEOUS) ×3 IMPLANT
SEAL XI 5MM-8MM UNIVERSAL (MISCELLANEOUS) ×3
SEALER VESSEL DA VINCI XI (MISCELLANEOUS) ×1
SEALER VESSEL EXT DVNC XI (MISCELLANEOUS) IMPLANT
SET TRI-LUMEN FLTR TB AIRSEAL (TUBING) ×2 IMPLANT
SOL PREP PVP 2OZ (MISCELLANEOUS)
SOLUTION ELECTROLUBE (MISCELLANEOUS) ×2 IMPLANT
SOLUTION PREP PVP 2OZ (MISCELLANEOUS) ×1 IMPLANT
SPONGE T-LAP 18X18 ~~LOC~~+RFID (SPONGE) ×2 IMPLANT
SPONGE T-LAP 4X18 ~~LOC~~+RFID (SPONGE) ×2 IMPLANT
STAPLER 45 DA VINCI SURE FORM (STAPLE)
STAPLER 45 SUREFORM DVNC (STAPLE) IMPLANT
STAPLER 60 DA VINCI SURE FORM (STAPLE) ×1
STAPLER 60 SUREFORM DVNC (STAPLE) IMPLANT
STAPLER CANNULA SEAL DVNC XI (STAPLE) ×1 IMPLANT
STAPLER CANNULA SEAL XI (STAPLE) ×1
STAPLER CIRCULAR MANUAL XL 29 (STAPLE) ×1 IMPLANT
STAPLER RELOAD 3.5X45 BLU DVNC (STAPLE)
STAPLER RELOAD 3.5X45 BLUE (STAPLE)
STAPLER RELOAD 3.5X60 BLU DVNC (STAPLE) ×1
STAPLER RELOAD 3.5X60 BLUE (STAPLE) ×1
STAPLER RELOADABLE 65 2-0 SUT (MISCELLANEOUS) IMPLANT
STAPLER SYS INTERNAL RELOAD SS (MISCELLANEOUS) ×2 IMPLANT
SURGILUBE 2OZ TUBE FLIPTOP (MISCELLANEOUS) ×2 IMPLANT
SUT MNCRL 4-0 (SUTURE) ×1
SUT MNCRL 4-0 27XMFL (SUTURE) ×1
SUT PDS PLUS 0 (SUTURE)
SUT PDS PLUS AB 0 CT-2 (SUTURE) ×2 IMPLANT
SUT PROLENE 2 0 SH DA (SUTURE) ×1 IMPLANT
SUT SILK 0 SH 30 (SUTURE) ×2 IMPLANT
SUT STRATAFIX PDS 30 CT-1 (SUTURE) ×1 IMPLANT
SUT VIC AB 3-0 SH 27 (SUTURE) ×1
SUT VIC AB 3-0 SH 27X BRD (SUTURE) ×4 IMPLANT
SUT VICRYL 0 AB UR-6 (SUTURE) ×2 IMPLANT
SUT VLOC 90 6 CV-15 VIOLET (SUTURE) ×2 IMPLANT
SUTURE MNCRL 4-0 27XMF (SUTURE) ×2 IMPLANT
SYR 30ML LL (SYRINGE) ×2 IMPLANT
TRAY FOLEY MTR SLVR 16FR STAT (SET/KITS/TRAYS/PACK) ×2 IMPLANT
WATER STERILE IRR 500ML POUR (IV SOLUTION) ×2 IMPLANT

## 2022-01-17 NOTE — Transfer of Care (Addendum)
Immediate Anesthesia Transfer of Care Note ? ?Patient: Christopher Espinoza ? ?Procedure(s) Performed: XI ROBOTIC ASSISTED COLOSTOMY TAKEDOWN (Abdomen) ? ?Patient Location: PACU ? ?Anesthesia Type:General ? ?Level of Consciousness: drowsy ? ?Airway & Oxygen Therapy: Patient Spontanous Breathing ? ?Post-op Assessment: Report given to RN ? ?Post vital signs: stable ? ?Last Vitals:  ?Vitals Value Taken Time  ?BP    ?Temp    ?Pulse    ?Resp    ?SpO2    ? ? ?Last Pain:  ?Vitals:  ? 01/17/22 0802  ?TempSrc: Temporal  ?PainSc: 7   ?   ? ?  ? ?Complications: No notable events documented. ?

## 2022-01-17 NOTE — Anesthesia Preprocedure Evaluation (Signed)
Anesthesia Evaluation  ?Patient identified by MRN, date of birth, ID band ?Patient awake ? ? ? ?Reviewed: ?Allergy & Precautions, H&P , NPO status , Patient's Chart, lab work & pertinent test results, reviewed documented beta blocker date and time  ? ?Airway ?Mallampati: II ? ?TM Distance: >3 FB ?Neck ROM: full ? ? ? Dental ? ?(+) Teeth Intact ?  ?Pulmonary ?neg pulmonary ROS, Current Smoker and Patient abstained from smoking.,  ?  ?Pulmonary exam normal ? ? ? ? ? ? ? Cardiovascular ?Exercise Tolerance: Good ?negative cardio ROS ?Normal cardiovascular exam ?Rhythm:regular Rate:Normal ? ? ?  ?Neuro/Psych ?PSYCHIATRIC DISORDERS Anxiety negative neurological ROS ?   ? GI/Hepatic ?negative GI ROS, Neg liver ROS,   ?Endo/Other  ?negative endocrine ROS ? Renal/GU ?negative Renal ROS  ?negative genitourinary ?  ?Musculoskeletal ? ? Abdominal ?  ?Peds ? Hematology ?negative hematology ROS ?(+)   ?Anesthesia Other Findings ?Past Medical History: ?No date: Diverticulitis of intestine, part unspecified, with  ?perforation and abscess with bleeding ?No date: History of kidney stones ?right foot: Neuropathy ?    Comment:  due to trigger toe ?No date: PTSD (post-traumatic stress disorder) ?Past Surgical History: ?07/16/2021: COLON RESECTION SIGMOID ?    Comment:  Procedure: COLON RESECTION SIGMOID;  Surgeon:  ?             Carolan Shiver, MD;  Location: [facility] ORS;  Service: ?             General;; ?12/30/2021: COLONOSCOPY WITH PROPOFOL; N/A ?    Comment:  Procedure: COLONOSCOPY WITH PROPOFOL;  Surgeon: Tonna Boehringer,  ?             Isami, DO;  Location: [facility] END[facility];  Service: General;  ?             Laterality: N/A; ?07/16/2021: COLOSTOMY ?    Comment:  Procedure: COLOSTOMY;  Surgeon: Carolan Shiver,  ?             MD;  Location: [facility] ORS;  Service: General;; ?2005: FOOT SURGERY; Right ?07/16/2021: LAPAROTOMY; N/A ?    Comment:  Procedure: EXPLORATORY LAPAROTOMY;  Surgeon:  ?              Carolan Shiver, MD;  Location: [facility] ORS;  Service: ?             General;  Laterality: N/A; ?BMI   ? Body Mass Index: 25.80 kg/m?  ?  ? Reproductive/Obstetrics ?negative OB ROS ? ?  ? ? ? ? ? ? ? ? ? ? ? ? ? ?  ?  ? ? ? ? ? ? ? ? ?Anesthesia Physical ?Anesthesia Plan ? ?ASA: 2 ? ?Anesthesia Plan: General ETT  ? ?Post-op Pain Management:   ? ?Induction:  ? ?PONV Risk Score and Plan: 2 ? ?Airway Management Planned:  ? ?Additional Equipment:  ? ?Intra-op Plan:  ? ?Post-operative Plan:  ? ?Informed Consent: I have reviewed the patients History and Physical, chart, labs and discussed the procedure including the risks, benefits and alternatives for the proposed anesthesia with the patient or authorized representative who has indicated his/her understanding and acceptance.  ? ? ? ?Dental Advisory Given ? ?Plan Discussed with: CRNA ? ?Anesthesia Plan Comments:   ? ? ? ? ? ? ?Anesthesia Quick Evaluation ? ?

## 2022-01-17 NOTE — Interval H&P Note (Signed)
History and Physical Interval Note: ? ?01/17/2022 ?9:37 AM ? ?Christopher Espinoza  has presented today for surgery, with the diagnosis of Z93.3 Colostomy Status.  The various methods of treatment have been discussed with the patient and family. After consideration of risks, benefits and other options for treatment, the patient has consented to  Procedure(s): ?XI ROBOTIC ASSISTED COLOSTOMY TAKEDOWN (N/A) as a surgical intervention.  The patient's history has been reviewed, patient examined, no change in status, stable for surgery.  I have reviewed the patient's chart and labs.  Questions were answered to the patient's satisfaction.   ? ? ?Christopher Espinoza ? ? ?

## 2022-01-17 NOTE — Op Note (Signed)
Preoperative diagnosis: Colostomy status ? ?Postoperative diagnosis: Same ? ?Procedure: Robotic assisted laparoscopic sigmoid colectomy. ?                    Robotic assisted laparoscopic splenic flexure takedown ? ?Anesthesia: GETA ?  ?Surgeon: Herbert Pun, MD ? ?Assistant: Dr. Lysle Pearl ?  ?Wound Classification: Clean contaminated ?  ?Specimen: Sigmoid colon ?  ?Complications: None ?  ?Estimated Blood Loss: 25 mL ? ? ?Indications: Patient is a 46 y.o. male with history of perforated diverticulitis with end colostomy creation.  He comes today for colostomy reversal. ?  ?FIndings: ?1.  Rectal stump staple line with significant scar tissue. ?2.  Adequate hemostasis.  ?3.  Tension-free anastomosis with airtight seal achieved ?  ?Description of procedure: The patient was placed on the operating table in the lithotomy position, both arms tucked. General anesthesia was induced. A time-out was completed verifying correct patient, procedure, site, positioning, and implant(s) and/or special equipment prior to beginning this procedure. The abdomen was prepped and draped in the usual sterile fashion.  ?  ?A Veress needle was inserted on Palmer's point.  Abdominal cavity was insufflated to 15 mmHg. Patient tolerated insufflation well.  An 8 mm port was inserted in an Optiview fashion in the right upper quadrant.  Two additional 8 mm ports and one 12 mm port were inserted under direct visualization along the right side of the abdominal wall. 5 mm assistant port was then placed on the right subcostal area.  No injuries from trocar placements were noted. The table was placed in the Trendelenburg position.  Xi robotic platform was then brought to the operative field and docked at an angle from the left lower quadrant.  Tip up grasper and force bipolar were placed in the left arm ports.  Scissors were placed in right arm port. ?  ?After lysis of adhesions, the rectal stump was inspected.  There was significant scar tissue on  the staple line with the rectal stump.  3 cm of rectal stump were noted to be resected to be able to anastomose the descending colon to the rectum.  This was done using vessel sealer dividing the mesentery.  The rectum was transected with a stapler. ? ?Then the colostomy was inspected.  Lysis of adhesions were done on the internal portion of the colostomy almost up to the skin.  The lateral attachments of the descending colon was mobilized.  The inferior mesenteric artery was ligated with clip and divided with vessel sealer.  Dissection was then guided proximally to the splenic flexure.  The splenic flexure was divided to be able to reach tension-free anastomosis of the descending colon and the rectum. ? ?Then I scrubbed back.  The mucocutaneous tissue of the colostomy was divided with electrocautery.  Dissection was carried down to fascia.  The descending colon was completely separated from the abdominal wall.  Blood pressure and device the distal portion of the descending colon was divided.  The descending colon was dilated with Hegar dilators.  29 and was inserted and fixed with pursestring device.  The descending colon was then inserted again back to the abdominal cavity.  The rectal stump specimen was removed through the ostomy wound.  The skin of the colostomy wound was approximated with 2-0 Prolene to be able to get back pneumoperitoneum.  Insufflation was again started.  Da Vinci robot was docked again. ? ?5 mg of ICG green was flushed intravenously and the site of adequate vascularity on the colonic  flap where identified. Once the descending colon was identified reaching the rectum without tension, the assistant surgeon introduced the 29 mm EEA rectally. It was guided under direct vision up to the level of the distal staple line on the rectal stump. The spike of the EEA device was then deployed to pierce the rectal stump in an end-to-side fashion, the anvil was then attached to this spike, and the EEA  device is closed and fired to perform a stapled end-to-end anastomosis. The doughnuts produced by the EEA stapler were checked to ensure that they were complete. Furthermore, an air leak test was carried out by insufflating air gently into the rectum, while the anastomosis was bathed underwater. A clamp was placed on the proximal colon to prevent its distention. Once the anastomosis was properly tested, the patient was repositioned back in the normal anatomical position.  A second Lembert layer was done with 3-0 V-Loc.  The trocars were removed under direct vision  ? ?Using excluding closure technique, the fascia of the colostomy wound was closed with a #0 STRATAFIX.  The skin of the colostomy was approximated with a pursestring with iodoform packing in place.  The skin trocar incisions where closed with 4-0 Monocryl sutures and a dry sterile dressing is applied. The sponge and instrument count were correct, blood loss was minimal, and there were no complications.  ?  ?The patient tolerated the procedure well, awakened from anesthesia and was taken to the postanesthesia care unit in satisfactory condition.  Foley still in place.  Sponge count and instrument count correct at the end of the procedure. ? ?

## 2022-01-17 NOTE — Anesthesia Procedure Notes (Signed)
Procedure Name: Intubation ?Date/Time: 01/17/2022 10:17 AM ?Performed by: Carter Kitten, CRNA ?Pre-anesthesia Checklist: Patient identified, Patient being monitored, Timeout performed, Emergency Drugs available and Suction available ?Patient Re-evaluated:Patient Re-evaluated prior to induction ?Oxygen Delivery Method: Circle system utilized ?Preoxygenation: Pre-oxygenation with 100% oxygen ?Induction Type: IV induction ?Ventilation: Mask ventilation without difficulty ?Laryngoscope Size: 3 and McGraph ?Grade View: Grade II ?Tube type: Oral ?Tube size: 7.0 mm ?Number of attempts: 1 ?Airway Equipment and Method: Stylet ?Placement Confirmation: ETT inserted through vocal cords under direct vision, positive ETCO2 and breath sounds checked- equal and bilateral ?Secured at: 21 cm ?Tube secured with: Tape ?Dental Injury: Teeth and Oropharynx as per pre-operative assessment  ? ? ? ? ?

## 2022-01-18 ENCOUNTER — Encounter: Payer: Self-pay | Admitting: General Surgery

## 2022-01-18 LAB — CBC
HCT: 41.4 % (ref 39.0–52.0)
Hemoglobin: 14 g/dL (ref 13.0–17.0)
MCH: 31 pg (ref 26.0–34.0)
MCHC: 33.8 g/dL (ref 30.0–36.0)
MCV: 91.8 fL (ref 80.0–100.0)
Platelets: 249 10*3/uL (ref 150–400)
RBC: 4.51 MIL/uL (ref 4.22–5.81)
RDW: 12.7 % (ref 11.5–15.5)
WBC: 11.6 10*3/uL — ABNORMAL HIGH (ref 4.0–10.5)
nRBC: 0 % (ref 0.0–0.2)

## 2022-01-18 LAB — BASIC METABOLIC PANEL
Anion gap: 6 (ref 5–15)
BUN: 9 mg/dL (ref 6–20)
CO2: 25 mmol/L (ref 22–32)
Calcium: 8.3 mg/dL — ABNORMAL LOW (ref 8.9–10.3)
Chloride: 106 mmol/L (ref 98–111)
Creatinine, Ser: 1.12 mg/dL (ref 0.61–1.24)
GFR, Estimated: >60 mL/min (ref >60)
Glucose, Bld: 99 mg/dL (ref 70–99)
Potassium: 4.6 mmol/L (ref 3.5–5.1)
Sodium: 137 mmol/L (ref 135–145)

## 2022-01-18 LAB — PHOSPHORUS: Phosphorus: 3.7 mg/dL (ref 2.5–4.6)

## 2022-01-18 LAB — MAGNESIUM: Magnesium: 1.8 mg/dL (ref 1.7–2.4)

## 2022-01-18 NOTE — Plan of Care (Signed)
?  Problem: Clinical Measurements: ?Goal: Ability to maintain clinical measurements within normal limits will improve ?Outcome: Progressing ?Goal: Will remain free from infection ?Outcome: Progressing ?Goal: Diagnostic test results will improve ?Outcome: Progressing ?Goal: Respiratory complications will improve ?Outcome: Progressing ?Goal: Cardiovascular complication will be avoided ?Outcome: Progressing ?  ?Problem: Pain Managment: ?Goal: General experience of comfort will improve ?Outcome: Progressing ?  ?Pt is involved in and agrees with the plan of care. V/S stable. Reports pain on surgical incision; Percocet given. Reports passing gas. FC in place draining well. ?

## 2022-01-18 NOTE — Progress Notes (Signed)
Patient ID: Christopher Espinoza, male   DOB: [birth year], 46 y.o.   MRN: [id number] ?    SURGICAL PROGRESS NOTE  ? ?Hospital Day(s): 1.  ? ?Interval History: Patient seen and examined, no acute events or new complaints overnight. Patient reports feeling well.  Endorses pain controlled with current pain medications.  Endorses passing a lot of gas.  Denies abdominal pain.  Denies nausea or vomiting. ? ?Vital signs in last 24 hours: [min-max] current  ?Temp:  [97.7 ?F (36.5 ?C)-98.3 ?F (36.8 ?C)] 97.7 ?F (36.5 ?C) (05/09 0818) ?Pulse Rate:  [64-107] 74 (05/09 0818) ?Resp:  [16-18] 16 (05/09 0818) ?BP: (118-128)/(72-88) 128/75 (05/09 0818) ?SpO2:  [91 %-93 %] 91 % (05/09 0818)     Height: 5\' 11"  (180.3 cm) Weight: 83.9 kg BMI (Calculated): 25.81  ? ?Physical Exam:  ?Constitutional: alert, cooperative and no distress  ?Respiratory: breathing non-labored at rest  ?Cardiovascular: regular rate and sinus rhythm  ?Gastrointestinal: soft, non-tender, and non-distended ? ?Labs:  ? ?  Latest Ref Rng & Units 01/18/2022  ?  5:09 AM 01/17/2022  ?  9:50 AM 07/20/2021  ?  1:53 PM  ?CBC  ?WBC 4.0 - 10.5 K/uL 11.6    10.3    ?Hemoglobin 13.0 - 17.0 g/dL 13/04/2021   91.6   38.4    ?Hematocrit 39.0 - 52.0 % 41.4   41.0   38.1    ?Platelets 150 - 400 K/uL 249    260    ? ? ?  Latest Ref Rng & Units 01/18/2022  ?  5:09 AM 01/17/2022  ?  9:50 AM 07/20/2021  ?  1:53 PM  ?CMP  ?Glucose 70 - 99 mg/dL 99   79   13/04/2021    ?BUN 6 - 20 mg/dL 9   9   17     ?Creatinine 0.61 - 1.24 mg/dL 993     5.70    ?Sodium 135 - 145 mmol/L 137   139   135    ?Potassium 3.5 - 5.1 mmol/L 4.6   4.1   3.8    ?Chloride 98 - 111 mmol/L 106   106   103    ?CO2 22 - 32 mmol/L 25    23    ?Calcium 8.9 - 10.3 mg/dL 8.3    8.4    ? ? ?Imaging studies: No new pertinent imaging studies ? ? ?Assessment/Plan:  ?46 y.o. male with colostomy status 1 Day Post-Op s/p robotic assisted upper scopic colostomy reversal. ? ? -Recovering adequately ? -Good vital signs without fever ? -Improve renal  function. ? -We will advance diet to full liquids since patient is passing gas.  Entereg discontinued. ?            -We will continue with pain management ?            -Continue to prophylaxis ?            -Encourage patient to ambulate ? ?9.39, MD ? ? ? ?

## 2022-01-18 NOTE — Progress Notes (Signed)
Mobility Specialist - Progress Note ? ? ? 01/18/22 1300  ?Mobility  ?Activity Ambulated independently in hallway  ?Level of Assistance Independent  ?Assistive Device Other (Comment) ?(IV Pole)  ?Distance Ambulated (ft) 400 ft  ?Activity Response Tolerated well  ?$Mobility charge 1 Mobility  ? ? ?Pt completes all activities indep --- voices complaints of mild shoulder pain. Pt returns to room with needs in reach. ? ?Clarisa Schools ?Mobility Specialist ?01/18/22, 1:05 PM ? ? ? ? ?

## 2022-01-18 NOTE — Anesthesia Postprocedure Evaluation (Signed)
Anesthesia Post Note ? ?Patient: Christopher Espinoza ? ?Procedure(s) Performed: XI ROBOTIC ASSISTED COLOSTOMY TAKEDOWN (Abdomen) ? ?Patient location during evaluation: PACU ?Anesthesia Type: General ?Level of consciousness: awake and alert ?Pain management: pain level controlled ?Vital Signs Assessment: post-procedure vital signs reviewed and stable ?Respiratory status: spontaneous breathing, nonlabored ventilation and respiratory function stable ?Cardiovascular status: blood pressure returned to baseline and stable ?Postop Assessment: no apparent nausea or vomiting ?Anesthetic complications: no ? ? ?No notable events documented. ? ? ?Last Vitals:  ?Vitals:  ? 01/18/22 0449 01/18/22 0818  ?BP: 120/72 128/75  ?Pulse: 64 74  ?Resp: 18 16  ?Temp: 36.6 ?C 36.5 ?C  ?SpO2: 93% 91%  ?  ?Last Pain:  ?Vitals:  ? 01/18/22 0818  ?TempSrc: Oral  ?PainSc:   ? ? ?  ?  ?  ?  ?  ?  ? ?Foye Deer ? ? ? ? ?

## 2022-01-19 LAB — SURGICAL PATHOLOGY

## 2022-01-19 MED ORDER — HYDROCODONE-ACETAMINOPHEN 5-325 MG PO TABS: 1.0000 | ORAL_TABLET | ORAL | 0 refills | Status: AC | PRN

## 2022-01-19 NOTE — Discharge Summary (Signed)
?  Patient ID: ?Christopher Espinoza ?MRN: [id number] ?DOB/AGE: [birth year] 46 y.o. ? ?Admit date: 01/17/2022 ?Discharge date: 01/19/2022 ? ? ?Discharge Diagnoses:  ?Principal Problem: ?  Colostomy status ([place]) ? ? ?Procedures: Robotic assisted laparoscopic colostomy reversal ? 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  He underwent colostomy reversal.  He tolerated the procedure well.  Patient has been ambulating.  Pain controlled.  Patient has been passing gas and had a bowel movement.  Patient tolerating soft diet.  No issues with the wound. ? ?Physical Exam ?HENT:  ?   Head: Normocephalic.  ?Cardiovascular:  ?   Rate and Rhythm: Normal rate and regular rhythm.  ?   Pulses: Normal pulses.  ?Pulmonary:  ?   Effort: Pulmonary effort is normal.  ?Abdominal:  ?   General: Abdomen is flat.  ?Musculoskeletal:  ?   Cervical back: Normal range of motion.  ?Skin: ?   Capillary Refill: Capillary refill takes less than 2 seconds.  ?Neurological:  ?   Mental Status: He is alert and oriented to person, place, and time.  ? ? ?Consults: None ? ?Disposition: Discharge disposition: 01-Home or Self Care ? ? ? ? ? ? ?Discharge Instructions   ? ? Diet - low sodium heart healthy   Complete by: As directed ?  ? Increase activity slowly   Complete by: As directed ?  ? ?  ? ?Allergies as of 01/19/2022   ? ?   Reactions  ? Shellfish Allergy Shortness Of Breath, Swelling  ? Gabapentin Nausea Only  ? ?  ? ?  ?Medication List  ?  ? ?TAKE these medications   ? ?HYDROcodone-acetaminophen 5-325 MG tablet ?Commonly known as: Norco ?Take 1 tablet by mouth every 4 (four) hours as needed for up to 3 days for moderate pain. ?  ?ondansetron 4 MG tablet ?Commonly known as: Zofran ?Take 1 tablet (4 mg total) by mouth daily as needed for nausea or vomiting. ?  ? ?  ? ? Follow-up Information   ? ? Herbert Pun, MD Follow up in 2 week(s).   ?Specialty: General Surgery ?Contact information: ?[address] ?[place] [address] ?[phone] ? ? ?  ?  ? ?  ?  ? ?  ? ? ?

## 2022-01-19 NOTE — Progress Notes (Signed)
Mobility Specialist - Progress Note ? ? 01/19/22 1400  ?Mobility  ?Activity Ambulated independently in hallway  ?Level of Assistance Independent  ?Assistive Device None  ?Distance Ambulated (ft) 400 ft  ?Activity Response Tolerated well  ?$Mobility charge 1 Mobility  ? ? ?Pt ambulated indep in hallway with no complaints and returns to room with needs in reach. ? ?Christopher Espinoza ?Mobility Specialist ?01/19/22, 2:47 PM ? ? ? ? ?

## 2022-01-19 NOTE — Discharge Instructions (Signed)

## 2022-01-20 ENCOUNTER — Ambulatory Visit: Payer: Self-pay | Admitting: *Deleted

## 2022-01-20 NOTE — Telephone Encounter (Signed)
?  Chief Complaint: Med question ?Symptoms: Pt had colostomy reversal today, [facility] with Codeine. Did not deliver. Pt questioning if he could take regular tylenol ?Frequency:  ?Pertinent Negatives: Patient denies  ?Disposition: [] ED /[] Urgent Care (no appt availability in office) / [] Appointment(In office/virtual)/ []  [place] [facility]/ [x] Home Care/ [] Refused Recommended Disposition /[] [place] [facility]/ []  Follow-up with PCP ?Additional Notes: Advised may take regular tylenol according to packaging directions. Advised ED if meeded. Verbalizes understanding. ?Reason for Disposition ?? Pharmacy calling with prescription question and triager answers question ? ?Answer Assessment - Initial Assessment Questions ?1. NAME of MEDICATION: "What medicine are you calling about?" ?    Tylenol ?2. QUESTION: "What is your question?" (e.g., double dose of medicine, side effect) ?    *No Answer* ?3. PRESCRIBING HCP: "Who prescribed it?" Reason: if prescribed by specialist, call should be referred to that group. ?    *No Answer* ?4. SYMPTOMS: "Do you have any symptoms?" ?    *No Answer* ?5. SEVERITY: If symptoms are present, ask "Are they mild, moderate or severe?" ?    *No Answer* ?6. PREGNANCY:  "Is there any chance that you are pregnant?" "When was your last menstrual period?" ?    *No Answer* ? ?Protocols used: Medication Question Call-A-AH ? ?

## 2022-05-24 IMAGING — CT CT RENAL STONE PROTOCOL
2 of 4 series · 15 of 46 positions shown, 17 images · non-contrast
Comparison: July 15, 2014.

CLINICAL DATA: Flank pain, suspected kidney stone in a 45-year-old
male.

EXAM:
CT ABDOMEN AND PELVIS WITHOUT CONTRAST
TECHNIQUE: Multidetector CT imaging of the abdomen and pelvis was performed
following the standard protocol without IV contrast.

[Series 2: stone full standard · axial · 0.84mm/px · z∈[-539,-54]mm · 12 of 107 slices shown, 14 images]
[im 5/107  soft-tissue]
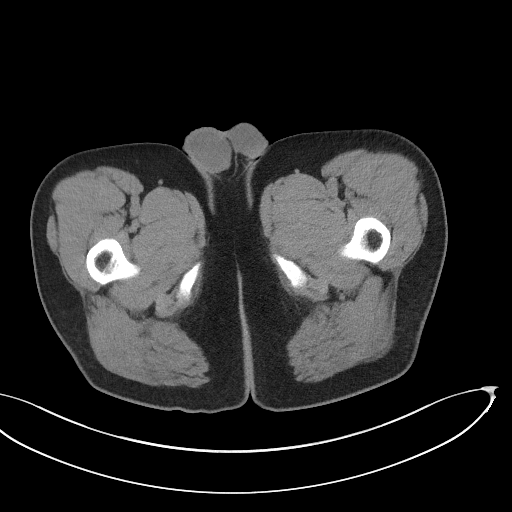
[im 5/107  bone]
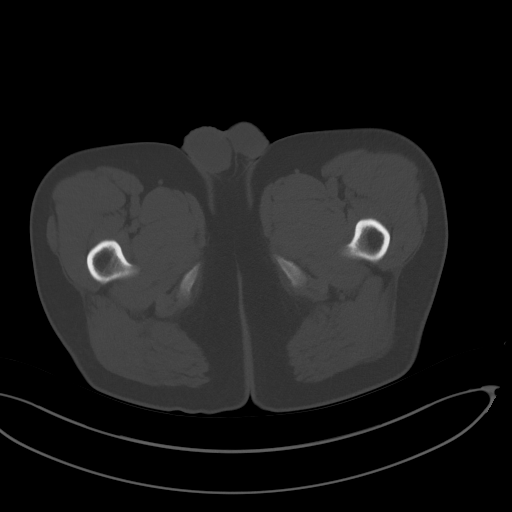
[im 15/107  soft-tissue]
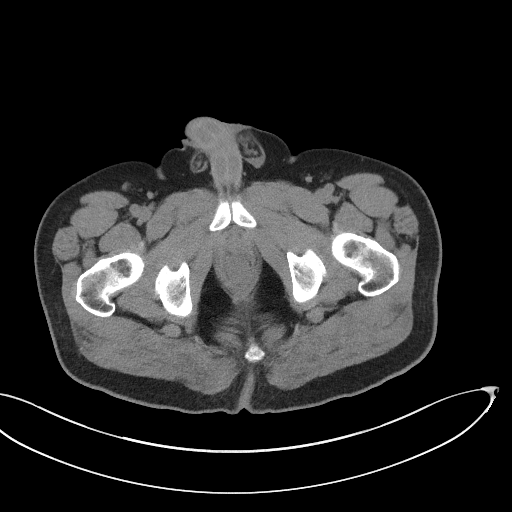
[im 25/107  soft-tissue]
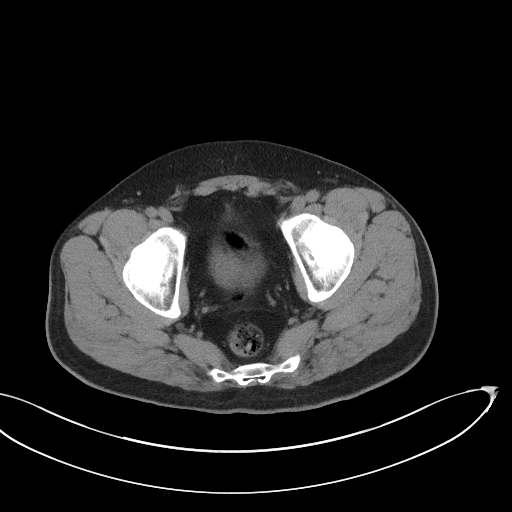
[im 34/107  soft-tissue]
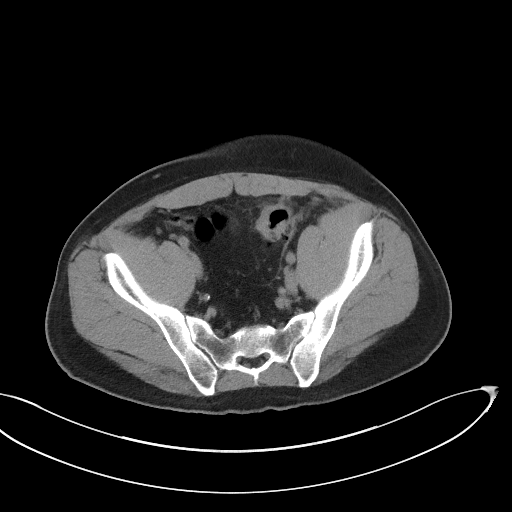
[im 39/107  soft-tissue]
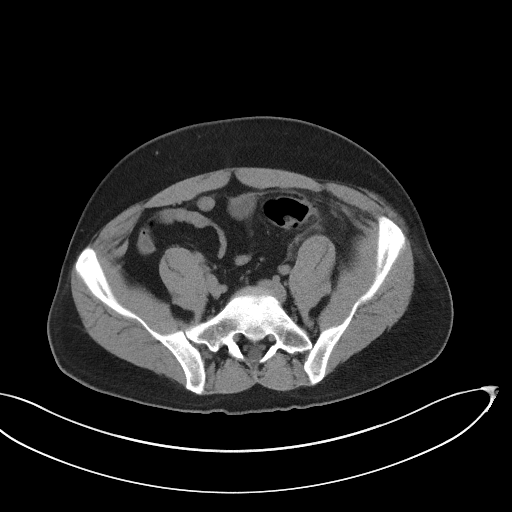
[im 49/107  soft-tissue]
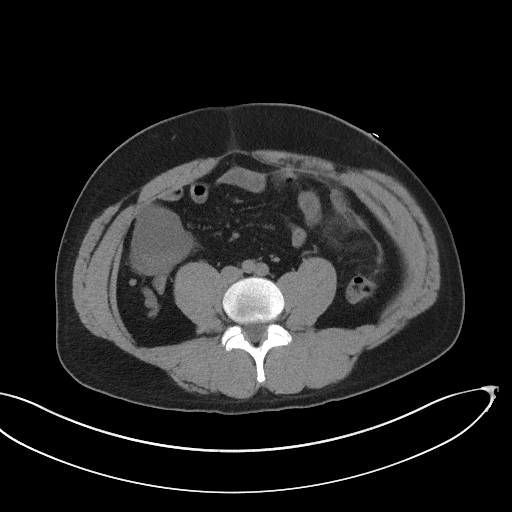
[im 58/107  soft-tissue]
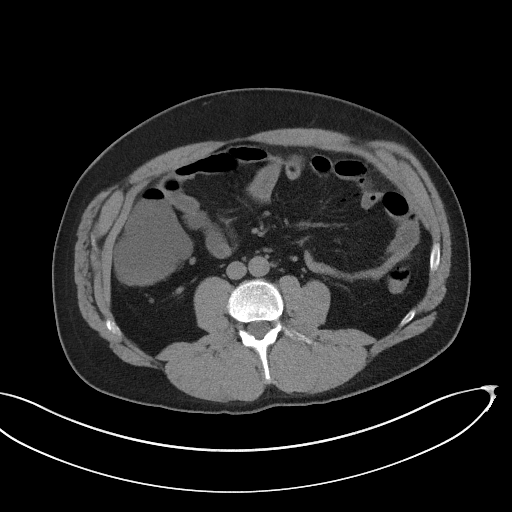
[im 68/107  soft-tissue]
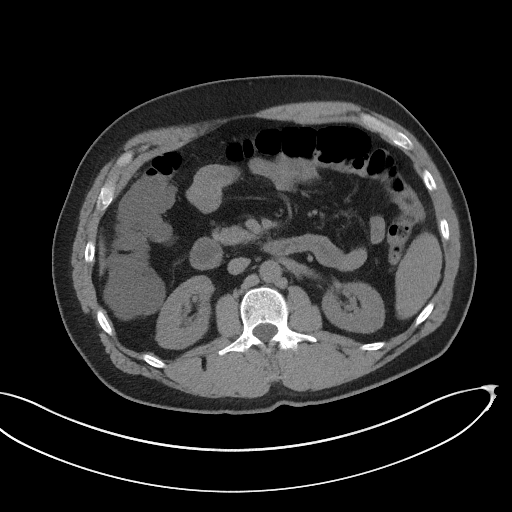
[im 73/107  soft-tissue]
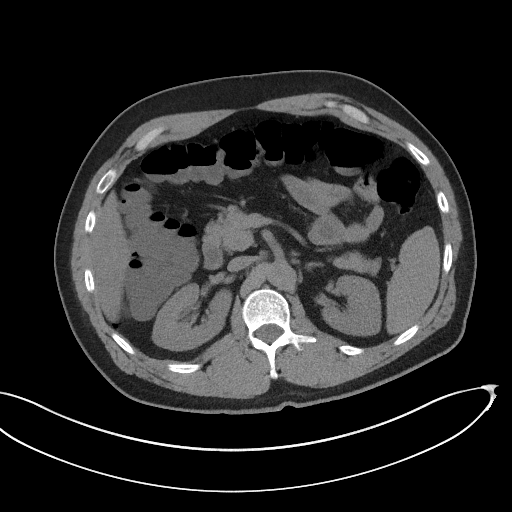
[im 73/107  bone]
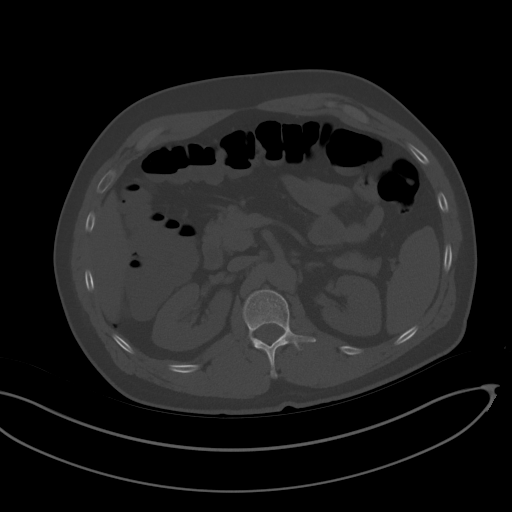
[im 82/107  soft-tissue]
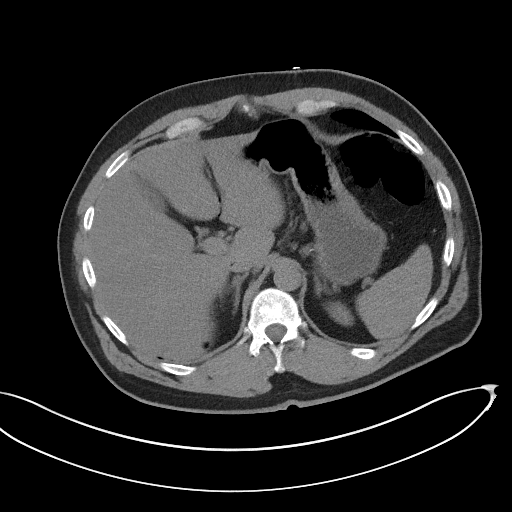
[im 92/107  soft-tissue]
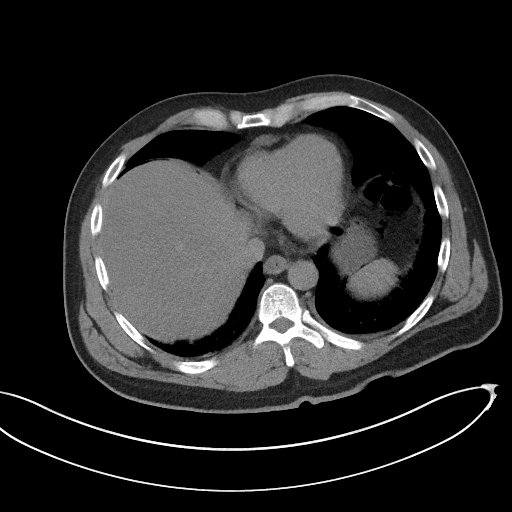
[im 102/107  soft-tissue]
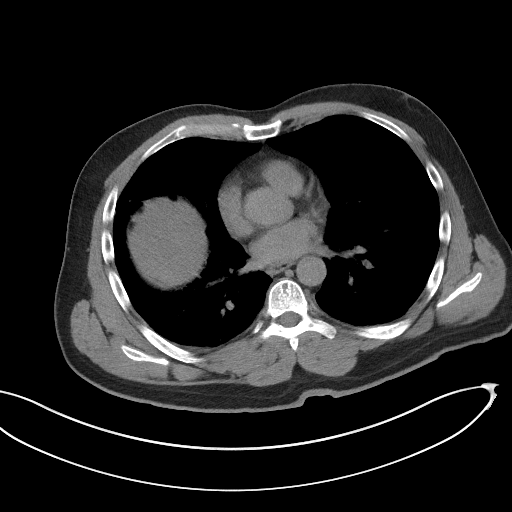

[Series 5: coronal · coronal · 0.75mm/px · 3 of 146 slices shown]
[im 49/146  soft-tissue]
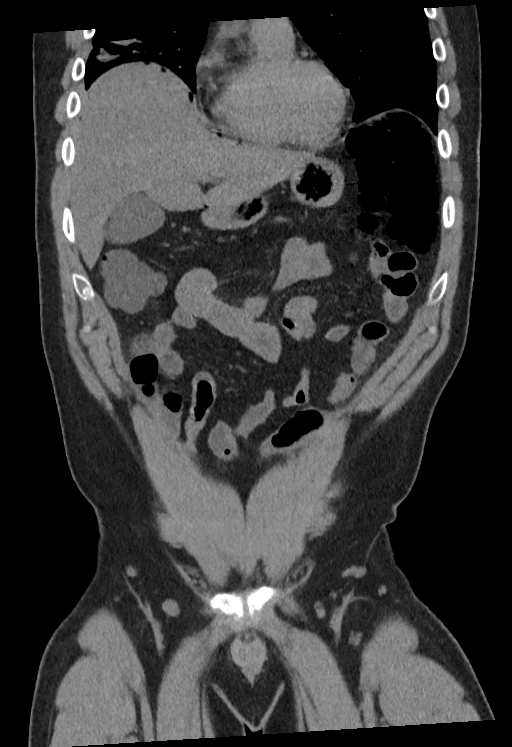
[im 65/146  soft-tissue]
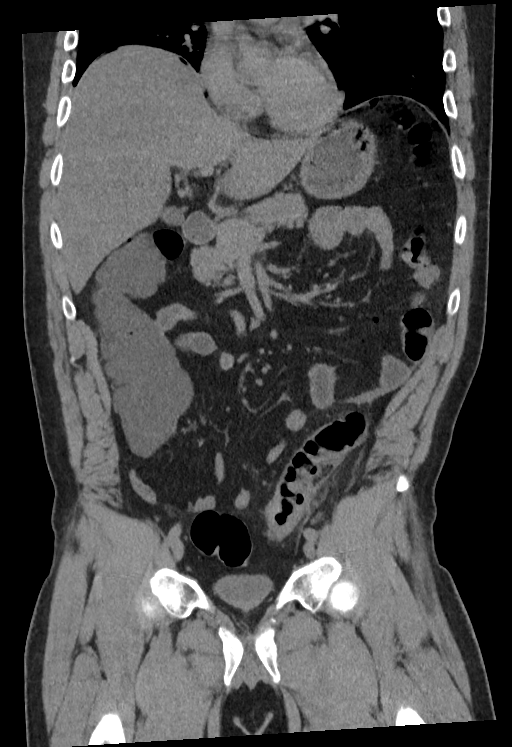
[im 81/146  soft-tissue]
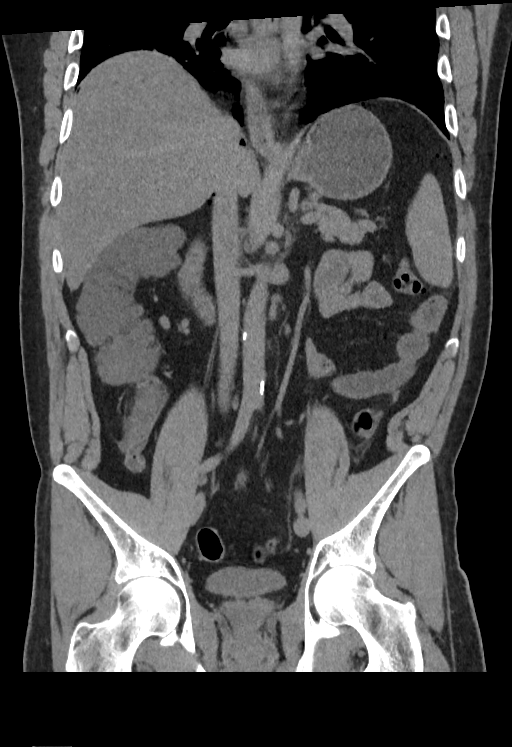

[15 of 46 positions shown; findings below may reference images not displayed]

FINDINGS: Lower chest: Nodular density at the RIGHT lung base (image [DATE]) this
measures approximately 8 x 7 mm but is incompletely imaged. Basilar
atelectasis admixed in this region. No effusion. No dense
consolidative process.

Hepatobiliary: Hepatic steatosis. No visible discrete lesion.
Steatosis at least moderate. No pericholecystic stranding.

Pancreas: Pancreas with smooth contours, no signs of inflammation.

Spleen: Spleen normal size and contour.

Adrenals/Urinary Tract: Adrenal glands are normal. Smooth contour
the bilateral kidneys. No hydronephrosis. No perivesical stranding
or perinephric stranding.

Stomach/Bowel: No perigastric stranding. No gastric distension. No
small bowel obstruction.

Appendix is normal.

Proximal colon is fluid-filled.

Sigmoid colon at the descending sigmoid junction with thickening and
small locules of pneumoperitoneum surrounding the colon in this
location mixed with stranding. Free intraperitoneal air tracks
beneath the RIGHT hemidiaphragm as well and about the liver.
Scattered locules of free air seen in the LEFT lower quadrant away
from the area perforated diverticulitis.

Vascular/Lymphatic:

Aortic atherosclerosis. No sign of aneurysm. Smooth contour of the
IVC. There is no gastrohepatic or hepatoduodenal ligament
lymphadenopathy. No retroperitoneal or mesenteric lymphadenopathy.

No pelvic sidewall lymphadenopathy.

Reproductive: Unremarkable

Other: Free air as above.  No abscess.  No ascites.

Musculoskeletal: No acute or destructive bone finding.
IMPRESSION: Signs of perforated diverticulitis with pneumoperitoneum tracking
into the LEFT lower quadrant small bowel mesentery and beneath the
RIGHT and LEFT hemidiaphragm.

Nodular density at the RIGHT lung base this measures approximately 8
x 7 mm but is incompletely imaged. Basilar atelectasis admixed in
this region. Given that this is on the first image of the CT exam
would suggest follow-up chest CT for more complete characterization
when the patient is able or in 4-6 weeks.

Hepatic steatosis.

Aortic Atherosclerosis (INBC0-GY8.8).

Critical Value/emergent results were called by telephone at the time
of interpretation on 07/16/2021 at [DATE] to provider ELJOSE PORQUET ,
who verbally acknowledged these results. At that time small nodule
in the LEFT lower lobe was discussed as well.
